# Patient Record
Sex: Female | Born: 1985 | Hispanic: No | Marital: Single | Smoking: Former smoker
Health system: Southern US, Community
[De-identification: ages and names within clinical notes are randomized; demographics above are authoritative.]

## PROBLEM LIST (undated history)

## (undated) DIAGNOSIS — D649 Anemia, unspecified: Secondary | ICD-10-CM

## (undated) HISTORY — PX: ECTOPIC PREGNANCY SURGERY: SHX613

---

## 2018-06-29 ENCOUNTER — Observation Stay (HOSPITAL_COMMUNITY)
Admission: EM | Admit: 2018-06-29 | Discharge: 2018-06-30 | Disposition: A | Discharge status: Home / Self Care | Payer: Self-pay | Attending: Internal Medicine | Admitting: Internal Medicine

## 2018-06-29 ENCOUNTER — Encounter (HOSPITAL_COMMUNITY): Payer: Self-pay | Admitting: Emergency Medicine

## 2018-06-29 ENCOUNTER — Other Ambulatory Visit: Payer: Self-pay

## 2018-06-29 DIAGNOSIS — Z79899 Other long term (current) drug therapy: Secondary | ICD-10-CM | POA: Insufficient documentation

## 2018-06-29 DIAGNOSIS — D509 Iron deficiency anemia, unspecified: Secondary | ICD-10-CM

## 2018-06-29 DIAGNOSIS — R112 Nausea with vomiting, unspecified: Secondary | ICD-10-CM

## 2018-06-29 DIAGNOSIS — N920 Excessive and frequent menstruation with regular cycle: Secondary | ICD-10-CM

## 2018-06-29 DIAGNOSIS — D649 Anemia, unspecified: Secondary | ICD-10-CM

## 2018-06-29 DIAGNOSIS — R51 Headache: Secondary | ICD-10-CM | POA: Insufficient documentation

## 2018-06-29 DIAGNOSIS — D62 Acute posthemorrhagic anemia: Secondary | ICD-10-CM | POA: Diagnosis present

## 2018-06-29 DIAGNOSIS — A084 Viral intestinal infection, unspecified: Principal | ICD-10-CM

## 2018-06-29 DIAGNOSIS — R519 Headache, unspecified: Secondary | ICD-10-CM

## 2018-06-29 DIAGNOSIS — D500 Iron deficiency anemia secondary to blood loss (chronic): Secondary | ICD-10-CM | POA: Insufficient documentation

## 2018-06-29 HISTORY — DX: Anemia, unspecified: D64.9

## 2018-06-29 LAB — CBC
HCT: 24.1 % — ABNORMAL LOW (ref 36.0–46.0)
Hemoglobin: 6.3 g/dL — CL (ref 12.0–15.0)
MCH: 15.9 pg — AB (ref 26.0–34.0)
MCHC: 26.1 g/dL — ABNORMAL LOW (ref 30.0–36.0)
MCV: 61 fL — ABNORMAL LOW (ref 80.0–100.0)
Platelets: 304 10*3/uL (ref 150–400)
RBC: 3.95 MIL/uL (ref 3.87–5.11)
RDW: 21.1 % — ABNORMAL HIGH (ref 11.5–15.5)
WBC: 7.2 10*3/uL (ref 4.0–10.5)
nRBC: 0 % (ref 0.0–0.2)

## 2018-06-29 LAB — URINALYSIS, ROUTINE W REFLEX MICROSCOPIC
Bilirubin Urine: NEGATIVE
Glucose, UA: NEGATIVE mg/dL
Ketones, ur: NEGATIVE mg/dL
Leukocytes,Ua: NEGATIVE
Nitrite: NEGATIVE
Protein, ur: NEGATIVE mg/dL
Specific Gravity, Urine: 1.015 (ref 1.005–1.030)
pH: 5 (ref 5.0–8.0)

## 2018-06-29 LAB — COMPREHENSIVE METABOLIC PANEL
ALBUMIN: 4.1 g/dL (ref 3.5–5.0)
ALT: 13 U/L (ref 0–44)
AST: 18 U/L (ref 15–41)
Alkaline Phosphatase: 39 U/L (ref 38–126)
Anion gap: 10 (ref 5–15)
BILIRUBIN TOTAL: 0.5 mg/dL (ref 0.3–1.2)
BUN: 6 mg/dL (ref 6–20)
CO2: 23 mmol/L (ref 22–32)
Calcium: 9.2 mg/dL (ref 8.9–10.3)
Chloride: 104 mmol/L (ref 98–111)
Creatinine, Ser: 0.68 mg/dL (ref 0.44–1.00)
GFR calc Af Amer: >60 mL/min (ref >60)
GFR calc non Af Amer: >60 mL/min (ref >60)
GLUCOSE: 94 mg/dL (ref 70–99)
Potassium: 3.7 mmol/L (ref 3.5–5.1)
Sodium: 137 mmol/L (ref 135–145)
Total Protein: 6.8 g/dL (ref 6.5–8.1)

## 2018-06-29 LAB — I-STAT BETA HCG BLOOD, ED (MC, WL, AP ONLY): I-stat hCG, quantitative: <5 m[IU]/mL (ref <5)

## 2018-06-29 LAB — LIPASE, BLOOD: Lipase: 36 U/L (ref 11–51)

## 2018-06-29 MED ORDER — ONDANSETRON 4 MG PO TBDP
4.0000 mg | ORAL_TABLET | Freq: Once | ORAL | Status: AC
  Administered 2018-06-29: 4 mg via ORAL
  Filled 2018-06-29: qty 1

## 2018-06-29 MED ORDER — SODIUM CHLORIDE 0.9% FLUSH: 3.0000 mL | Freq: Once | INTRAVENOUS | Status: DC

## 2018-06-29 NOTE — ED Notes (Signed)
EDP [facility] notified of critical hgb. No new orders were provided at this time.

## 2018-06-29 NOTE — ED Triage Notes (Signed)
Pt with sudden onset of vomiting while waiting in the lobby with another patient. She reports 2 episodes and no other symptoms. NAD.

## 2018-06-30 ENCOUNTER — Observation Stay (HOSPITAL_COMMUNITY): Payer: Self-pay

## 2018-06-30 DIAGNOSIS — D509 Iron deficiency anemia, unspecified: Secondary | ICD-10-CM

## 2018-06-30 DIAGNOSIS — D62 Acute posthemorrhagic anemia: Secondary | ICD-10-CM | POA: Diagnosis present

## 2018-06-30 DIAGNOSIS — D649 Anemia, unspecified: Secondary | ICD-10-CM | POA: Diagnosis present

## 2018-06-30 DIAGNOSIS — R51 Headache: Secondary | ICD-10-CM

## 2018-06-30 DIAGNOSIS — A084 Viral intestinal infection, unspecified: Secondary | ICD-10-CM

## 2018-06-30 DIAGNOSIS — N920 Excessive and frequent menstruation with regular cycle: Secondary | ICD-10-CM

## 2018-06-30 DIAGNOSIS — R519 Headache, unspecified: Secondary | ICD-10-CM

## 2018-06-30 LAB — HEMOGLOBIN AND HEMATOCRIT, BLOOD
HEMATOCRIT: 30.2 % — AB (ref 36.0–46.0)
Hemoglobin: 8.7 g/dL — ABNORMAL LOW (ref 12.0–15.0)

## 2018-06-30 LAB — CBC
HCT: 23 % — ABNORMAL LOW (ref 36.0–46.0)
Hemoglobin: 6.2 g/dL — CL (ref 12.0–15.0)
MCH: 16.1 pg — ABNORMAL LOW (ref 26.0–34.0)
MCHC: 27 g/dL — AB (ref 30.0–36.0)
MCV: 59.7 fL — ABNORMAL LOW (ref 80.0–100.0)
Platelets: 277 10*3/uL (ref 150–400)
RBC: 3.85 MIL/uL — ABNORMAL LOW (ref 3.87–5.11)
RDW: 21.2 % — ABNORMAL HIGH (ref 11.5–15.5)
WBC: 5.7 10*3/uL (ref 4.0–10.5)
nRBC: 0 % (ref 0.0–0.2)

## 2018-06-30 LAB — FERRITIN: Ferritin: 1 ng/mL — ABNORMAL LOW (ref 11–307)

## 2018-06-30 LAB — IRON AND TIBC
Iron: 8 ug/dL — ABNORMAL LOW (ref 28–170)
Saturation Ratios: 2 % — ABNORMAL LOW (ref 10.4–31.8)
TIBC: 458 ug/dL — ABNORMAL HIGH (ref 250–450)
UIBC: 450 ug/dL

## 2018-06-30 LAB — PREPARE RBC (CROSSMATCH)

## 2018-06-30 LAB — ABO/RH: ABO/RH(D): B POS

## 2018-06-30 LAB — HIV ANTIBODY (ROUTINE TESTING W REFLEX): HIV Screen 4th Generation wRfx: NONREACTIVE

## 2018-06-30 MED ORDER — ACETAMINOPHEN 650 MG RE SUPP: 650.0000 mg | Freq: Four times a day (QID) | RECTAL | Status: DC | PRN

## 2018-06-30 MED ORDER — MORPHINE SULFATE (PF) 2 MG/ML IV SOLN
INTRAVENOUS | Status: AC
  Administered 2018-06-30: 2 mg via INTRAVENOUS
  Filled 2018-06-30: qty 1

## 2018-06-30 MED ORDER — FERROUS SULFATE 325 (65 FE) MG PO TABS
325.0000 mg | ORAL_TABLET | Freq: Three times a day (TID) | ORAL | Status: DC
  Administered 2018-06-30 (×2): 325 mg via ORAL
  Filled 2018-06-30 (×3): qty 1

## 2018-06-30 MED ORDER — SODIUM CHLORIDE 0.9 % IV SOLN
510.0000 mg | Freq: Once | INTRAVENOUS | Status: AC
  Administered 2018-06-30: 510 mg via INTRAVENOUS
  Filled 2018-06-30: qty 17

## 2018-06-30 MED ORDER — SODIUM CHLORIDE 0.9 % IV SOLN: INTRAVENOUS | Status: AC

## 2018-06-30 MED ORDER — ACETAMINOPHEN 325 MG PO TABS
650.0000 mg | ORAL_TABLET | Freq: Four times a day (QID) | ORAL | Status: DC | PRN
  Administered 2018-06-30 (×2): 650 mg via ORAL
  Filled 2018-06-30 (×2): qty 2

## 2018-06-30 MED ORDER — ONDANSETRON HCL 4 MG/2ML IJ SOLN
4.0000 mg | Freq: Once | INTRAMUSCULAR | Status: AC
  Administered 2018-06-30: 4 mg via INTRAVENOUS
  Filled 2018-06-30: qty 2

## 2018-06-30 MED ORDER — SODIUM CHLORIDE 0.9 % IV SOLN: 10.0000 mL/h | Freq: Once | INTRAVENOUS | Status: DC

## 2018-06-30 MED ORDER — ONDANSETRON HCL 4 MG/2ML IJ SOLN
4.0000 mg | Freq: Four times a day (QID) | INTRAMUSCULAR | Status: DC | PRN
  Administered 2018-06-30: 4 mg via INTRAVENOUS
  Filled 2018-06-30: qty 2

## 2018-06-30 NOTE — Discharge Summary (Signed)
Maureen Hensley, is a 33 y.o. female  DOB 1986-01-03  MRN [id number].  Admission date:  06/29/2018  Admitting Physician  John Giovanni, MD  Discharge Date:  06/30/2018   Primary MD  No primary care provider on file.  Recommendations for primary care physician for things to follow:  -Check CBC, BMP during next visit, patient to follow-up with gynecology service as an outpatient regarding menorrhagia.   Admission Diagnosis  Symptomatic anemia [D64.9] Non-intractable vomiting with nausea, unspecified vomiting type [R11.2]   Discharge Diagnosis  Symptomatic anemia [D64.9] Non-intractable vomiting with nausea, unspecified vomiting type [R11.2]   Principal Problem:   Viral gastroenteritis Active Problems:   Symptomatic anemia   Iron deficiency anemia   Menorrhagia   HA (headache)      Past Medical History:  Diagnosis Date  . Anemia     Past Surgical History:  Procedure Laterality Date  . CESAREAN SECTION     3  . ECTOPIC PREGNANCY SURGERY         History of present illness and  Hospital Course:     Kindly see H&P for history of present illness and admission details, please review complete Labs, Consult reports and Test reports for all details in brief  HPI  from the history and physical done on the day of admission 06/30/2018  33 y.o. female with medical history significant of G4P3 (1 ectopic pregnancy), menorrhagia, anemia presenting to the hospital from the waiting room for evaluation of vomiting.  Patient states she was in her usual state of health and accompanied her sister-in-law to the hospital.  Her sister-in-law has been vomiting.  In the ED waiting room, patient started vomiting all of a sudden.  Reports having a few episodes of NBNB emesis since then.  Denies having any abdominal pain or diarrhea.  Blood work done in the ED revealed hemoglobin 6.3.  Patient reports having chronic  anemia secondary to heavy menstrual bleeding.  She is currently taking an iron supplement at home.  She just moved here recently from [place] and does not have any doctors here.  States her menstrual cycles are usually 7 days long and the first 3 days she has excessive bleeding which requires her to change her pad every hour.  Also notices clots mixed with blood.  Her last menstrual period ended 2 days ago.  She is not having any vaginal bleeding at present.  States she was seen at [facility] in [place] for this problem previously about 2 years ago but was never started on any birth control pills.  She previously received blood transfusion for her anemia about a year ago.  Reports having intermittent headaches and fatigue for the past few days.  Denies having any dizziness, chest pain, or shortness of breath.  Denies having any melena or hematochezia.  Hospital Course   Viral gastroenteritis -While in ED waiting with a family member, she had an episode of nausea and vomiting, this has resolved with no recurrence, tolerating oral intake, afebrile, leukocytosis,  LFTs and lipase within normal limit.  Chronic blood loss anemia/iron deficiency anemia -This is secondary to menorrhagia, hemoglobin 6.3, she was transfused 2 units PRBC, with good response, hemoglobin 8.7 on discharge, very low iron at 8, and very low ferritin level at 1, she is on oral iron supplement at home, attempted to give IV Feraheme, but she did develop lower back pain, so I have stopped her IV Feraheme, no signs of allergic reaction, no hives, no rash, shortness of breath, she was instructed to continue with oral iron supplements on discharge, to follow with PCP as an outpatient, I have given her the information for [facility] on discharge to call and schedule an appointment.   -Patient was in the ED waiting room accompanying a family member and presented with acute onset NBNB emesis.  Family member with similar  symptoms.  Denies having any abdominal pain or diarrhea.  Abdominal exam benign. -Afebrile and no leukocytosis.  LFTs and lipase normal. -IV Zofran PRN -IV fluid hydration    Discharge Condition:  Stable   Follow UP  Follow-up Information    [facility]. Go to.   Why:  your appointment is scheduled for Thursday, July 22, 2018 at 3:00pm.  Please arrive 30 minutes prior to your appointment  Contact information: [address] [place] [address] [phone]       [facility]              Follow up in 1 week(s).   Why:  please call [phone] Contact information: [facility]            Discharge Instructions  and  Discharge Medications     Discharge Instructions    Discharge instructions   Complete by:  As directed    Follow with Primary MD in 7 days   Get CBC, CMP,  checked  by Primary MD next visit.    Activity: As tolerated with Full fall precautions use walker/cane & assistance as needed   Disposition Home    Diet: Regular diet   On your next visit with your primary care physician please Get Medicines reviewed and adjusted.   Please request your Prim.MD to go over all Hospital Tests and Procedure/Radiological results at the follow up, please get all Hospital records sent to your Prim MD by signing hospital release before you go home.   If you experience worsening of your admission symptoms, develop shortness of breath, life threatening emergency, suicidal or homicidal thoughts you must seek medical attention immediately by calling 911 or calling your MD immediately  if symptoms less severe.  You Must read complete instructions/literature along with all the possible adverse reactions/side effects for all the Medicines you take and that have been prescribed to you. Take any new Medicines after you have completely understood and accpet all the possible adverse reactions/side effects.    Do not drive, operating heavy machinery, perform activities at heights, swimming or participation in water activities or provide baby sitting services if your were admitted for syncope or siezures until you have seen by Primary MD or a Neurologist and advised to do so again.  Do not drive when taking Pain medications.    Do not take more than prescribed Pain, Sleep and Anxiety Medications  Special Instructions: If you have smoked or chewed Tobacco  in the last 2 yrs please stop smoking, stop any regular Alcohol  and or any Recreational drug use.  Wear  Seat belts while driving.   Please note  You were cared for by a hospitalist during your hospital stay. If you have any questions about your discharge medications or the care you received while you were in the hospital after you are discharged, you can call the unit and asked to speak with the hospitalist on call if the hospitalist that took care of you is not available. Once you are discharged, your primary care physician will handle any further medical issues. Please note that NO REFILLS for any discharge medications will be authorized once you are discharged, as it is imperative that you return to your primary care physician (or establish a relationship with a primary care physician if you do not have one) for your aftercare needs so that they can reassess your need for medications and monitor your lab values.   Increase activity slowly   Complete by:  As directed      Allergies as of 06/30/2018   No Known Allergies     Medication List    TAKE these medications   ferrous sulfate 325 (65 FE) MG tablet Take 325 mg by mouth 3 (three) times daily with meals.         Diet and Activity recommendation: See Discharge Instructions above   Consults obtained -  None   Major procedures and Radiology Reports - PLEASE review detailed and final reports for all details, in brief -     US Pelvic Complete With Transvaginal  Result  Date: 06/30/2018 CLINICAL DATA:  Initial evaluation for menorrhagia. EXAM: TRANSABDOMINAL AND TRANSVAGINAL ULTRASOUND OF PELVIS TECHNIQUE: Both transabdominal and transvaginal ultrasound examinations of the pelvis were performed. Transabdominal technique was performed for global imaging of the pelvis including uterus, ovaries, adnexal regions, and pelvic cul-de-sac. It was necessary to proceed with endovaginal exam following the transabdominal exam to visualize the uterus, endometrium, and ovaries. COMPARISON:  None FINDINGS: Uterus Measurements: 9.2 x 4.5 x 5.8 cm = volume: 125.5 mL. No fibroids or other mass visualized. Endometrium Thickness: 13.4 mm. Small amount of mildly complex anechoic fluid present within the endometrial cavity. No focal abnormality visualized. Right ovary Measurements: 3.5 x 2.0 x 2.6 cm = volume: 9.3 mL. Normal appearance/no adnexal mass. 1.6 x 1.1 x 1.6 cm simple cyst, most compatible with a normal physiologic cyst/dominant follicle. Left ovary Measurements: 3.2 x 2.1 x 2.2 cm = volume: 7.9 mL. Normal appearance/no adnexal mass. Other findings Small volume free physiologic fluid present within the pelvis. IMPRESSION: 1. Endometrial stripe measures 13.4 mm in thickness without focal abnormality. If bleeding remains unresponsive to hormonal or medical therapy, sonohysterogram should be considered for possible occult focal lesion work-up. (Ref: Radiological Reasoning: Algorithmic Workup of Abnormal Vaginal Bleeding with Endovaginal Sonography and Sonohysterography. AJR 2008; 784:O96-29). 2. Small volume free fluid within the pelvis, presumably physiologic. 3. Otherwise unremarkable and normal pelvic ultrasound. Electronically Signed   By: Rise Mu M.D.   On: 06/30/2018 13:56    Micro Results    No results found for this or any previous visit (from the past 240 hour(s)).     Today   Subjective:   Addisen Elbaz today has any dizziness, lightheadedness, chest pain or  shortness of breath.   Objective:   Blood pressure 108/77, pulse 77, temperature 98.7 F (37.1 C), temperature source Oral, resp. rate 16, height 5\' 2"  (1.575 m), weight 52.2 kg, last menstrual period 06/27/2018, SpO2 100 %.   Intake/Output Summary (Last 24 hours) at 06/30/2018 1617 Last data filed at  06/30/2018 1300 Gross per 24 hour  Intake 1043.25 ml  Output 450 ml  Net 593.25 ml    Exam Awake Alert, Oriented x 3, No new F.N deficits, Normal affect Symmetrical Chest wall movement, Good air movement bilaterally, CTAB RRR,No Gallops,Rubs or new Murmurs, No Parasternal Heave +ve B.Sounds, Abd Soft, No rebound -guarding or rigidity. No Cyanosis, Clubbing or edema, No new Rash or bruise  Data Review   CBC w Diff:  Lab Results  Component Value Date   WBC 5.7 06/30/2018   HGB 8.7 (L) 06/30/2018   HCT 30.2 (L) 06/30/2018   PLT 277 06/30/2018    CMP:  Lab Results  Component Value Date   NA 137 06/29/2018   K 3.7 06/29/2018   CL 104 06/29/2018   CO2 23 06/29/2018   BUN 6 06/29/2018   CREATININE 0.68 06/29/2018   PROT 6.8 06/29/2018   ALBUMIN 4.1 06/29/2018   BILITOT 0.5 06/29/2018   ALKPHOS 39 06/29/2018   AST 18 06/29/2018   ALT 13 06/29/2018  .   Total Time in preparing paper work, data evaluation and todays exam - 30 minutes  Huey Bienenstockawood Nevan Creighton M.D on 06/30/2018 at 4:17 PM  [facility]   Office  [phone]

## 2018-06-30 NOTE — ED Provider Notes (Signed)
TIME SEEN: 1:02 AM  CHIEF COMPLAINT: Nausea, vomiting  HPI: Patient is a 33 year old female with history of anemia with previous blood transfusion from heavy menstrual cycles who presents to the emergency department with nausea and vomiting.  States she was here with her sister-in-law who is also having vomiting when she started vomiting.  She has had total of 3 episodes of nonbloody, nonbilious vomiting.  She reports she did felt lightheaded today and has had some intermittent headaches.  Found to be anemic here with a hemoglobin of 6.3.  States over the past several months she has had more irregular cycles that have been more frequent.  She finished her menstrual cycle 2 days ago.  She states the last 7 days and she will pass clots.  She has had several blood transfusions in the past.  Just recently moved here from [place] and does not have a primary care doctor.  States her last blood transfusion was approximately a year ago.  Does take iron tablets regularly.  Denies any chest pain or shortness of breath.  No hematemesis, hematochezia, melena.  No abdominal pain.  No diarrhea.  No fever.  ROS: See HPI Constitutional: no fever  Eyes: no drainage  ENT: no runny nose   Cardiovascular:  no chest pain  Resp: no SOB  GI:  vomiting GU: no dysuria Integumentary: no rash  Allergy: no hives  Musculoskeletal: no leg swelling  Neurological: no slurred speech ROS otherwise negative  PAST MEDICAL HISTORY/PAST SURGICAL HISTORY:  Past Medical History:  Diagnosis Date  . Anemia     MEDICATIONS:  Prior to Admission medications   Not on File    ALLERGIES:  No Known Allergies  SOCIAL HISTORY:  Social History   Tobacco Use  . Smoking status: Never Smoker  . Smokeless tobacco: Never Used  Substance Use Topics  . Alcohol use: Not Currently    FAMILY HISTORY: No family history on file.  EXAM: BP 114/67 (BP Location: Right Arm)   Pulse 99   Temp 98.2 F (36.8 C)   Resp 19   Ht 5'  2" (1.575 m)   Wt 52.2 kg   LMP 06/27/2018 (Exact Date)   SpO2 100%   BMI 21.03 kg/m  CONSTITUTIONAL: Alert and oriented and responds appropriately to questions. Well-appearing; well-nourished HEAD: Normocephalic EYES: Conjunctivae clear, pupils appear equal, EOMI ENT: normal nose; moist mucous membranes NECK: Supple, no meningismus, no nuchal rigidity, no LAD  CARD: RRR; S1 and S2 appreciated; no murmurs, no clicks, no rubs, no gallops RESP: Normal chest excursion without splinting or tachypnea; breath sounds clear and equal bilaterally; no wheezes, no rhonchi, no rales, no hypoxia or respiratory distress, speaking full sentences ABD/GI: Normal bowel sounds; non-distended; soft, non-tender, no rebound, no guarding, no peritoneal signs, no hepatosplenomegaly BACK:  The back appears normal and is non-tender to palpation, there is no CVA tenderness EXT: Normal ROM in all joints; non-tender to palpation; no edema; normal capillary refill; no cyanosis, no calf tenderness or swelling    SKIN:  warm; no rash, skin appears slightly pale NEURO: Moves all extremities equally PSYCH: The patient's mood and manner are appropriate. Grooming and personal hygiene are appropriate.  MEDICAL DECISION MAKING: Patient here with nausea and vomiting.  Suspect that she has a viral illness given sister-in-law here with similar symptoms.  Abdominal exam is benign.  Doubt appendicitis, colitis, diverticulitis, bowel obstruction, pancreatitis, cholecystitis.  Labs found to be unremarkable other than hemoglobin level of 6.3.  I suspect  this is from her heavy menstrual cycles.  She has had previous blood transfusions, last was approximately 1 year ago.  Is on iron tablets.  Blood pressure currently in the 100 systolic and she appears pale but otherwise is hemodynamically stable.  I recommend a blood transfusion and she agrees.  We will give 2 units packed red blood cells and admit to medicine.  ED PROGRESS: 1:36 AM  Discussed patient's case with hospitalist, Dr. Loney Loh.  I have recommended admission and patient (and family if present) agree with this plan. Admitting physician will place admission orders.   I reviewed all nursing notes, vitals, pertinent previous records, EKGs, lab and urine results, imaging (as available).       CRITICAL CARE Performed by: Rochele Raring   Total critical care time: 45 minutes  Critical care time was exclusive of separately billable procedures and treating other patients.  Critical care was necessary to treat or prevent imminent or life-threatening deterioration.  Critical care was time spent personally by me on the following activities: development of treatment plan with patient and/or surrogate as well as nursing, discussions with consultants, evaluation of patient's response to treatment, examination of patient, obtaining history from patient or surrogate, ordering and performing treatments and interventions, ordering and review of laboratory studies, ordering and review of radiographic studies, pulse oximetry and re-evaluation of patient's condition.    Mylin Gignac, Layla Maw, DO 06/30/18 [phone]

## 2018-06-30 NOTE — Discharge Instructions (Signed)
Follow with Primary MD  in 7 days   Get CBC, CMP,  checked  by Primary MD next visit.    Activity: As tolerated with Full fall precautions use walker/cane & assistance as needed   Disposition Home    Diet: Regular diet   On your next visit with your primary care physician please Get Medicines reviewed and adjusted.   Please request your Prim.MD to go over all Hospital Tests and Procedure/Radiological results at the follow up, please get all Hospital records sent to your Prim MD by signing hospital release before you go home.   If you experience worsening of your admission symptoms, develop shortness of breath, life threatening emergency, suicidal or homicidal thoughts you must seek medical attention immediately by calling 911 or calling your MD immediately  if symptoms less severe.  You Must read complete instructions/literature along with all the possible adverse reactions/side effects for all the Medicines you take and that have been prescribed to you. Take any new Medicines after you have completely understood and accpet all the possible adverse reactions/side effects.   Do not drive, operating heavy machinery, perform activities at heights, swimming or participation in water activities or provide baby sitting services if your were admitted for syncope or siezures until you have seen by Primary MD or a Neurologist and advised to do so again.  Do not drive when taking Pain medications.    Do not take more than prescribed Pain, Sleep and Anxiety Medications  Special Instructions: If you have smoked or chewed Tobacco  in the last 2 yrs please stop smoking, stop any regular Alcohol  and or any Recreational drug use.  Wear Seat belts while driving.   Please note  You were cared for by a hospitalist during your hospital stay. If you have any questions about your discharge medications or the care you received while you were in the hospital after you are discharged, you can call the  unit and asked to speak with the hospitalist on call if the hospitalist that took care of you is not available. Once you are discharged, your primary care physician will handle any further medical issues. Please note that NO REFILLS for any discharge medications will be authorized once you are discharged, as it is imperative that you return to your primary care physician (or establish a relationship with a primary care physician if you do not have one) for your aftercare needs so that they can reassess your need for medications and monitor your lab values.  

## 2018-06-30 NOTE — H&P (Addendum)
History and Physical    Maureen Hensley MGQ:[id number] DOB: 01-07-1986 DOA: 06/29/2018  PCP: No primary care provider on file. Patient coming from: ED waiting room  Chief Complaint: Vomiting  HPI: Maureen Hensley is a 33 y.o. female with medical history significant of G4P3 (1 ectopic pregnancy), menorrhagia, anemia presenting to the hospital from the waiting room for evaluation of vomiting.  Patient states she was in her usual state of health and accompanied her sister-in-law to the hospital.  Her sister-in-law has been vomiting.  In the ED waiting room, patient started vomiting all of a sudden.  Reports having a few episodes of NBNB emesis since then.  Denies having any abdominal pain or diarrhea.  Blood work done in the ED revealed hemoglobin 6.3.  Patient reports having chronic anemia secondary to heavy menstrual bleeding.  She is currently taking an iron supplement at home.  She just moved here recently from [place] and does not have any doctors here.  States her menstrual cycles are usually 7 days long and the first 3 days she has excessive bleeding which requires her to change her pad every hour.  Also notices clots mixed with blood.  Her last menstrual period ended 2 days ago.  She is not having any vaginal bleeding at present.  States she was seen at [facility] in [place] for this problem previously about 2 years ago but was never started on any birth control pills.  She previously received blood transfusion for her anemia about a year ago.  Reports having intermittent headaches and fatigue for the past few days.  Denies having any dizziness, chest pain, or shortness of breath.  Denies having any melena or hematochezia.  Review of Systems: As per HPI otherwise 10 point review of systems negative.  Past Medical History:  Diagnosis Date  . Anemia     Past Surgical History:  Procedure Laterality Date  . CESAREAN SECTION     3  . ECTOPIC PREGNANCY SURGERY       reports that  she has never smoked. She has never used smokeless tobacco. She reports previous alcohol use. She reports previous drug use.  No Known Allergies  No family history on file.  Prior to Admission medications   Medication Sig Start Date End Date Taking? Authorizing Provider  ferrous sulfate 325 (65 FE) MG tablet Take 325 mg by mouth 3 (three) times daily with meals.   Yes [provider]    Physical Exam: Vitals:   06/30/18 0145 06/30/18 0200 06/30/18 0226 06/30/18 0228  BP: 106/75 107/69 100/60   Pulse: 93 87 82   Resp: 15 (!) 22 18   Temp:    98.2 F (36.8 C)  TempSrc:    Oral  SpO2: 100% 100% 100%   Weight:      Height:        Physical Exam  Constitutional: She is oriented to person, place, and time. She appears well-developed and well-nourished. No distress.  HENT:  Head: Normocephalic.  Mouth/Throat: Oropharynx is clear and moist.  Eyes: Right eye exhibits no discharge. Left eye exhibits no discharge.  Neck: Neck supple.  Cardiovascular: Normal rate, regular rhythm and intact distal pulses.  Pulmonary/Chest: Effort normal and breath sounds normal. No respiratory distress. She has no wheezes. She has no rales.  Abdominal: Soft. Bowel sounds are normal. She exhibits no distension. There is no abdominal tenderness. There is no rebound and no guarding.  Musculoskeletal:  General: No edema.  Neurological: She is alert and oriented to person, place, and time. No cranial nerve deficit.  Strength 5 out of 5 in bilateral upper and lower extremities. Sensation to light touch intact throughout.  Skin: Skin is warm and dry. She is not diaphoretic.  Psychiatric: She has a normal mood and affect. Her behavior is normal.     Labs on Admission: I have personally reviewed following labs and imaging studies  CBC: Recent Labs  Lab 06/29/18 2214  WBC 7.2  HGB 6.3*  HCT 24.1*  MCV 61.0*  PLT 304   Basic Metabolic Panel: Recent Labs  Lab 06/29/18 2214  NA 137    K 3.7  CL 104  CO2 23  GLUCOSE 94  BUN 6  CREATININE 0.68  CALCIUM 9.2   GFR: Estimated Creatinine Clearance: 80.6 mL/min (by C-G formula based on SCr of 0.68 mg/dL). Liver Function Tests: Recent Labs  Lab 06/29/18 2214  AST 18  ALT 13  ALKPHOS 39  BILITOT 0.5  PROT 6.8  ALBUMIN 4.1   Recent Labs  Lab 06/29/18 2214  LIPASE 36   No results for input(s): AMMONIA in the last 168 hours. Coagulation Profile: No results for input(s): INR, PROTIME in the last 168 hours. Cardiac Enzymes: No results for input(s): CKTOTAL, CKMB, CKMBINDEX, TROPONINI in the last 168 hours. BNP (last 3 results) No results for input(s): PROBNP in the last 8760 hours. HbA1C: No results for input(s): HGBA1C in the last 72 hours. CBG: No results for input(s): GLUCAP in the last 168 hours. Lipid Profile: No results for input(s): CHOL, HDL, LDLCALC, TRIG, CHOLHDL, LDLDIRECT in the last 72 hours. Thyroid Function Tests: No results for input(s): TSH, T4TOTAL, FREET4, T3FREE, THYROIDAB in the last 72 hours. Anemia Panel: No results for input(s): VITAMINB12, FOLATE, FERRITIN, TIBC, IRON, RETICCTPCT in the last 72 hours. Urine analysis:    Component Value Date/Time   COLORURINE YELLOW 06/29/2018 2206   APPEARANCEUR HAZY (A) 06/29/2018 2206   LABSPEC 1.015 06/29/2018 2206   PHURINE 5.0 06/29/2018 2206   GLUCOSEU NEGATIVE 06/29/2018 2206   HGBUR SMALL (A) 06/29/2018 2206   BILIRUBINUR NEGATIVE 06/29/2018 2206   KETONESUR NEGATIVE 06/29/2018 2206   PROTEINUR NEGATIVE 06/29/2018 2206   NITRITE NEGATIVE 06/29/2018 2206   LEUKOCYTESUR NEGATIVE 06/29/2018 2206    Radiological Exams on Admission: No results found.  Assessment/Plan Principal Problem:   Viral gastroenteritis Active Problems:   Symptomatic anemia   Iron deficiency anemia   Menorrhagia   HA (headache)   Viral gastroenteritis -Patient was in the ED waiting room accompanying a family member and presented with acute onset NBNB  emesis.  Family member with similar symptoms.  Denies having any abdominal pain or diarrhea.  Abdominal exam benign. -Afebrile and no leukocytosis.  LFTs and lipase normal. -IV Zofran PRN -IV fluid hydration  Symptomatic iron deficiency anemia secondary to menorrhagia -Patient is presenting with complaints of fatigue.  Hemoglobin 6.3 and MCV 61, no prior baseline.  Her last menstrual cycle ended 2 days ago.  No vaginal bleeding at present.  Hemodynamically stable. -Type and screen -2 units PRBCs ordered in the ED -Repeat CBC in a.m. -Check iron, ferritin, TIBC -Continue home iron supplement -Complete pelvic ultrasound -Consider discussing with gynecology in the morning.  Intermittent headaches -Patient reports having intermittent headaches for the past few days. -No neuro deficits and appears very comfortable on exam. -Tylenol PRN  DVT prophylaxis: SCDs Code Status: Full code Family Communication: No family available. Disposition Plan:  Anticipate discharge in 1 to 2 days. Consults called: None Admission status: Observation   John Giovanni MD [facility] Pager [phone]  If 7PM-7AM, please contact night-coverage [email or web address] Password [email or web address]  06/30/2018, 2:54 AM

## 2018-06-30 NOTE — Progress Notes (Signed)
Pt noted to have severe pain to feraheme. IV stopped immediately, and MD notified. Pt was given 2mg  IV morphine for severe pain. Plan to [facility] pt for a couple of hours and then discharge home. Pt is to follow up outpatient with OBGYN. Pt is no longer in acute distress. Continue to monitor.

## 2018-06-30 NOTE — Care Management Note (Signed)
Case Management Note  Patient Details  Name: DEVION OKONSKI MRN: [id number] Date of Birth: 03-22-1986  Subjective/Objective:   33 yr old female seen in Ed for vomiting. Being evaluated for viral gastroenteritis and menorrhagia.  Will be transfused to units for HGB of 6.3 today.                Action/Plan: CM was asked to make appointment for patient who is uninsured and has no PCP. CM scheduled appointment with [facility] for Thursday, July 22, 2018 at 3:00pm. Appointment has been added to discharge.    Expected Discharge Date:    07/29/18              Expected Discharge Plan:  Home/Self Care  In-House Referral:  NA  Discharge planning Services  CM Consult, Follow-up appt scheduled, [facility]  Post Acute Care Choice:  NA Choice offered to:  NA  DME Arranged:  N/A DME Agency:  NA  HH Arranged:  NA HH Agency:  NA  Status of Service:  Completed, signed off  If discussed at Long Length of Stay Meetings, dates discussed:    Additional Comments:  Durenda Guthrie, RN 06/30/2018, 10:38 AM

## 2018-06-30 NOTE — Plan of Care (Signed)
  Problem: RH SAFETY Goal: RH STG ADHERE TO SAFETY PRECAUTIONS W/ASSISTANCE/DEVICE Description STG Adhere to Safety Precautions With Assistance/Device. Outcome: Adequate for Discharge Goal: RH STG DECREASED RISK OF FALL WITH ASSISTANCE Description STG Decreased Risk of Fall With Assistance. Outcome: Adequate for Discharge   Problem: Consults Goal: [facility] GENERAL PATIENT EDUCATION Description See Patient Education module for education specifics. Outcome: Adequate for Discharge Goal: Skin Care Protocol Initiated - if Braden Score 18 or less Description If consults are not indicated, leave blank or document N/A Outcome: Adequate for Discharge Goal: Nutrition Consult-if indicated Outcome: Adequate for Discharge Goal: Diabetes Guidelines if Diabetic/Glucose > 140 Description If diabetic or lab glucose is > 140 mg/dl - Initiate Diabetes/Hyperglycemia Guidelines & Document Interventions  Outcome: Adequate for Discharge   Problem: Education: Goal: Knowledge of General Education information will improve Description Including pain rating scale, medication(s)/side effects and non-pharmacologic comfort measures Outcome: Adequate for Discharge   Problem: Health Behavior/Discharge Planning: Goal: Ability to manage health-related needs will improve Outcome: Adequate for Discharge   Problem: Clinical Measurements: Goal: Ability to maintain clinical measurements within normal limits will improve Outcome: Adequate for Discharge Goal: Will remain free from infection Outcome: Adequate for Discharge Goal: Diagnostic test results will improve Outcome: Adequate for Discharge Goal: Respiratory complications will improve Outcome: Adequate for Discharge Goal: Cardiovascular complication will be avoided Outcome: Adequate for Discharge   Problem: Activity: Goal: Risk for activity intolerance will decrease Outcome: Adequate for Discharge   Problem: Nutrition: Goal: Adequate nutrition will be  maintained Outcome: Adequate for Discharge   Problem: Coping: Goal: Level of anxiety will decrease Outcome: Adequate for Discharge   Problem: Elimination: Goal: Will not experience complications related to bowel motility Outcome: Adequate for Discharge Goal: Will not experience complications related to urinary retention Outcome: Adequate for Discharge   Problem: Pain Managment: Goal: General experience of comfort will improve Outcome: Adequate for Discharge   Problem: Safety: Goal: Ability to remain free from injury will improve Outcome: Adequate for Discharge   Problem: Skin Integrity: Goal: Risk for impaired skin integrity will decrease Outcome: Adequate for Discharge

## 2018-07-01 ENCOUNTER — Encounter: Payer: Self-pay | Admitting: Obstetrics & Gynecology

## 2018-07-01 LAB — BPAM RBC
Blood Product Expiration Date: 202003192359
Blood Product Expiration Date: 202003192359
ISSUE DATE / TIME: 202002260303
ISSUE DATE / TIME: 202002260900
Unit Type and Rh: 7300
Unit Type and Rh: 7300

## 2018-07-01 LAB — TYPE AND SCREEN
ABO/RH(D): B POS
Antibody Screen: NEGATIVE
Unit division: 0
Unit division: 0

## 2018-07-10 ENCOUNTER — Emergency Department (HOSPITAL_COMMUNITY)
Admission: EM | Admit: 2018-07-10 | Discharge: 2018-07-10 | Disposition: A | Discharge status: Home / Self Care | Payer: Self-pay | Attending: Emergency Medicine | Admitting: Emergency Medicine

## 2018-07-10 ENCOUNTER — Encounter (HOSPITAL_COMMUNITY): Payer: Self-pay | Admitting: Emergency Medicine

## 2018-07-10 ENCOUNTER — Other Ambulatory Visit: Payer: Self-pay

## 2018-07-10 DIAGNOSIS — M5431 Sciatica, right side: Secondary | ICD-10-CM | POA: Insufficient documentation

## 2018-07-10 DIAGNOSIS — Z79899 Other long term (current) drug therapy: Secondary | ICD-10-CM | POA: Insufficient documentation

## 2018-07-10 MED ORDER — METHOCARBAMOL 500 MG PO TABS: 500.0000 mg | ORAL_TABLET | Freq: Two times a day (BID) | ORAL | 0 refills | Status: DC

## 2018-07-10 NOTE — ED Notes (Signed)
Discharge instructions and prescription discussed with Pt. Pt verbalized understanding. Pt stable and ambulatory.    

## 2018-07-10 NOTE — ED Provider Notes (Signed)
[facility] EMERGENCY DEPARTMENT Provider Note   CSN: [id number] Arrival date & time: 07/10/18  1256    History   Chief Complaint Chief Complaint  Patient presents with  . Leg Pain    HPI Maureen Hensley is a 33 y.o. female.     HPI   33 year old female presents today with complaints of right back and leg pain.  She notes symptoms started 2 days ago with an ache in her low back radiating down into her right leg.  She notes this is worse with ambulation worse with straight leg raise.  She denies any loss of distal sensation strength or motor function.  She denies any fever, trauma, lower extremity swelling or edema, or any red flags for back pain.  She notes taking Tylenol without symptomatic improvement.  No history of the same.    Past Medical History:  Diagnosis Date  . Anemia     Patient Active Problem List   Diagnosis Date Noted  . Symptomatic anemia 06/30/2018  . Iron deficiency anemia 06/30/2018  . Menorrhagia 06/30/2018  . Viral gastroenteritis 06/30/2018  . HA (headache) 06/30/2018    Past Surgical History:  Procedure Laterality Date  . CESAREAN SECTION     3  . ECTOPIC PREGNANCY SURGERY       OB History   No obstetric history on file.      Home Medications    Prior to Admission medications   Medication Sig Start Date End Date Taking? Authorizing Provider  ferrous sulfate 325 (65 FE) MG tablet Take 325 mg by mouth 3 (three) times daily with meals.    [provider]  methocarbamol (ROBAXIN) 500 MG tablet Take 1 tablet (500 mg total) by mouth 2 (two) times daily. 07/10/18   Eyvonne Mechanic, PA-C    Family History History reviewed. No pertinent family history.  Social History Social History   Tobacco Use  . Smoking status: Never Smoker  . Smokeless tobacco: Never Used  Substance Use Topics  . Alcohol use: Not Currently  . Drug use: Not Currently     Allergies   Patient has no known allergies.   Review of  Systems Review of Systems  All other systems reviewed and are negative.    Physical Exam Updated Vital Signs BP 100/72 (BP Location: Right Arm) Comment: baseline per pt  Pulse 91   Temp 97.6 F (36.4 C) (Oral)   Resp 16   LMP 06/27/2018 (Exact Date)   SpO2 100%   Physical Exam Vitals signs and nursing note reviewed.  Constitutional:      Appearance: She is well-developed.  HENT:     Head: Normocephalic and atraumatic.  Eyes:     General: No scleral icterus.       Right eye: No discharge.        Left eye: No discharge.     Conjunctiva/sclera: Conjunctivae normal.     Pupils: Pupils are equal, round, and reactive to light.  Neck:     Musculoskeletal: Normal range of motion.     Vascular: No JVD.     Trachea: No tracheal deviation.  Pulmonary:     Effort: Pulmonary effort is normal.     Breath sounds: No stridor.  Musculoskeletal:     Comments: No CT or L-spine tenderness palpation, no redness swelling or erythema, tenderness palpation of right upper gluteus, straight leg positive, no swelling or edema to the bilateral lower extremities, distal sensation strength and motor function intact  Neurological:     Mental Status: She is alert and oriented to person, place, and time.     Coordination: Coordination normal.  Psychiatric:        Behavior: Behavior normal.        Thought Content: Thought content normal.        Judgment: Judgment normal.      ED Treatments / Results  Labs (all labs ordered are listed, but only abnormal results are displayed) Labs Reviewed - No data to display  EKG None  Radiology No results found.  Procedures Procedures (including critical care time)  Medications Ordered in ED Medications - No data to display   Initial Impression / Assessment and Plan / ED Course  I have reviewed the triage vital signs and the nursing notes.  Pertinent labs & imaging results that were available during my care of the patient were reviewed by me and  considered in my medical decision making (see chart for details).        33 year old female presents today with likely sciatica.  She has no red flags for back pain, well-appearing in no acute distress.  She will be discharged with symptomatic care and strict return precautions.  She verbalized understanding and agreement to today's plan and had no further questions or concerns at the time of discharge.   Final Clinical Impressions(s) / ED Diagnoses   Final diagnoses:  Sciatica of right side    ED Discharge Orders         Ordered    methocarbamol (ROBAXIN) 500 MG tablet  2 times daily     07/10/18 1315           Eyvonne Mechanic, PA-C 07/10/18 1315    Shaune Pollack, MD 07/10/18 [id number]

## 2018-07-10 NOTE — Discharge Instructions (Addendum)
Please read attached information. If you experience any new or worsening signs or symptoms please return to the emergency room for evaluation. Please follow-up with your primary care provider or specialist as discussed. Please use medication prescribed only as directed and discontinue taking if you have any concerning signs or symptoms.   °

## 2018-07-10 NOTE — ED Triage Notes (Signed)
Pt reports lower back and leg pain x 2 days, no relief with icy hot, denies injury.

## 2018-07-22 ENCOUNTER — Inpatient Hospital Stay: Payer: Self-pay | Admitting: Critical Care Medicine

## 2018-07-26 ENCOUNTER — Telehealth: Payer: Self-pay | Admitting: Obstetrics & Gynecology

## 2018-07-26 ENCOUNTER — Other Ambulatory Visit: Payer: Self-pay

## 2018-07-26 NOTE — Progress Notes (Signed)
I called to do her Telemedicine visit as scheduled. I could hear a child crying in the background. I asked her if this was a good time for the visit and she said "No". I asked if she would like to reschedule and she said "yes". I told her to speak with the front office staff to reschedule.

## 2018-07-26 NOTE — Telephone Encounter (Signed)
Called the patient to inform of the cancellation of appointment due the virus restrictions. Left a detailed voicemail with scripting. Also informed the patient of the option of a virtual visit, if interested please call our clinic.

## 2018-12-29 ENCOUNTER — Encounter (HOSPITAL_COMMUNITY): Payer: Self-pay | Admitting: Emergency Medicine

## 2018-12-29 ENCOUNTER — Emergency Department (HOSPITAL_COMMUNITY)
Admission: EM | Admit: 2018-12-29 | Discharge: 2018-12-29 | Disposition: A | Discharge status: Home / Self Care | Payer: Self-pay | Attending: Emergency Medicine | Admitting: Emergency Medicine

## 2018-12-29 DIAGNOSIS — K1370 Unspecified lesions of oral mucosa: Secondary | ICD-10-CM | POA: Insufficient documentation

## 2018-12-29 MED ORDER — ACYCLOVIR 400 MG PO TABS: 400.0000 mg | ORAL_TABLET | Freq: Three times a day (TID) | ORAL | 0 refills | Status: DC

## 2018-12-29 MED ORDER — OXYCODONE-ACETAMINOPHEN 5-325 MG PO TABS: 1.0000 | ORAL_TABLET | Freq: Four times a day (QID) | ORAL | 0 refills | Status: DC | PRN

## 2018-12-29 MED ORDER — LIDOCAINE VISCOUS HCL 2 % MT SOLN: 15.0000 mL | OROMUCOSAL | 0 refills | Status: DC | PRN

## 2018-12-29 MED ORDER — OXYCODONE-ACETAMINOPHEN 5-325 MG PO TABS
1.0000 | ORAL_TABLET | Freq: Once | ORAL | Status: AC
  Administered 2018-12-29: 13:00:00 1 via ORAL
  Filled 2018-12-29: qty 1

## 2018-12-29 NOTE — Discharge Instructions (Addendum)
Please read attached information. If you experience any new or worsening signs or symptoms please return to the emergency room for evaluation. Please follow-up with your primary care provider or specialist as discussed. Please use medication prescribed only as directed and discontinue taking if you have any concerning signs or symptoms.   °

## 2018-12-29 NOTE — ED Provider Notes (Signed)
[facility] EMERGENCY DEPARTMENT Provider Note   CSN: [id number] Arrival date & time: 12/29/18  1104     History   Chief Complaint Chief Complaint  Patient presents with  . Oral Swelling    HPI Maureen Hensley is a 33 y.o. female.     HPI   33 year old female presents today with a 2-day history of swelling to her lips.  She notes pain and swelling to the bottom lip with sores in her mouth.  She denies any fever or systemic symptoms, no other rashes noted.  She notes she has children at home but they are not experiencing similar symptoms.  She notes she has had this several times in the past and was placed on antibiotics for this.  She denies any history of HSV, she denies any chronic health conditions she is not pregnant or breast-feeding.   Past Medical History:  Diagnosis Date  . Anemia     Patient Active Problem List   Diagnosis Date Noted  . Symptomatic anemia 06/30/2018  . Iron deficiency anemia 06/30/2018  . Menorrhagia 06/30/2018  . Viral gastroenteritis 06/30/2018  . HA (headache) 06/30/2018    Past Surgical History:  Procedure Laterality Date  . CESAREAN SECTION     3  . ECTOPIC PREGNANCY SURGERY       OB History   No obstetric history on file.      Home Medications    Prior to Admission medications   Medication Sig Start Date End Date Taking? Authorizing Provider  acyclovir (ZOVIRAX) 400 MG tablet Take 1 tablet (400 mg total) by mouth 3 (three) times daily. 12/29/18   Zeke Aker, Dellis Filbert, PA-C  ferrous sulfate 325 (65 FE) MG tablet Take 325 mg by mouth 3 (three) times daily with meals.    [provider]  lidocaine (XYLOCAINE) 2 % solution Use as directed 15 mLs in the mouth or throat as needed for mouth pain. 12/29/18   Latrelle Bazar, Dellis Filbert, PA-C  methocarbamol (ROBAXIN) 500 MG tablet Take 1 tablet (500 mg total) by mouth 2 (two) times daily. 07/10/18   Adiba Fargnoli, Dellis Filbert, PA-C  oxyCODONE-acetaminophen (PERCOCET/ROXICET) 5-325 MG tablet  Take 1 tablet by mouth every 6 (six) hours as needed. 12/29/18   Okey Regal, PA-C    Family History No family history on file.  Social History Social History   Tobacco Use  . Smoking status: Never Smoker  . Smokeless tobacco: Never Used  Substance Use Topics  . Alcohol use: Not Currently  . Drug use: Not Currently     Allergies   Patient has no known allergies.   Review of Systems Review of Systems  All other systems reviewed and are negative.    Physical Exam Updated Vital Signs BP 101/71 (BP Location: Right Arm)   Pulse 85   Temp 98.6 F (37 C) (Oral)   Resp 16   SpO2 100%   Physical Exam Vitals signs and nursing note reviewed.  Constitutional:      Appearance: She is well-developed.  HENT:     Head: Normocephalic and atraumatic.     Comments: Pearline Cables ulcerations noted to the lower lip and buccal mucosa -no lesions noted to the tongue or posterior oropharynx, no signs of surrounding erythema to the lip, minor bleeding noted Eyes:     General: No scleral icterus.       Right eye: No discharge.        Left eye: No discharge.     Conjunctiva/sclera: Conjunctivae normal.  Pupils: Pupils are equal, round, and reactive to light.  Neck:     Musculoskeletal: Normal range of motion.     Vascular: No JVD.     Trachea: No tracheal deviation.  Pulmonary:     Effort: Pulmonary effort is normal.     Breath sounds: No stridor.  Skin:    Comments: Single macule of the right palm, single macule to the left sole- remained of skin without lesions  Neurological:     Mental Status: She is alert and oriented to person, place, and time.     Coordination: Coordination normal.  Psychiatric:        Behavior: Behavior normal.        Thought Content: Thought content normal.        Judgment: Judgment normal.      ED Treatments / Results  Labs (all labs ordered are listed, but only abnormal results are displayed) Labs Reviewed  HSV CULTURE AND TYPING    EKG None   Radiology No results found.  Procedures Procedures (including critical care time)  Medications Ordered in ED Medications  oxyCODONE-acetaminophen (PERCOCET/ROXICET) 5-325 MG per tablet 1 tablet (1 tablet Oral Given 12/29/18 1235)     Initial Impression / Assessment and Plan / ED Course  I have reviewed the triage vital signs and the nursing notes.  Pertinent labs & imaging results that were available during my care of the patient were reviewed by me and considered in my medical decision making (see chart for details).        33 year old female presents today with oral swelling.  This appears to be a viral etiology, question HSV, also question hand-foot-and-mouth although very limited involvement of the hand and feet.  She is well-appearing in no acute distress.  She is tolerating oral.  Patient will be discharged with pain medication oral rinse and acyclovir to cover for questionable HSV.  Viral culture sent.  She will return immediately if she develops any new or worsening signs or symptoms.  Verbalized understanding and agreement to this plan had no further questions concerns the time discharge.  Final Clinical Impressions(s) / ED Diagnoses   Final diagnoses:  Unspecified lesions of oral mucosa    ED Discharge Orders         Ordered    oxyCODONE-acetaminophen (PERCOCET/ROXICET) 5-325 MG tablet  Every 6 hours PRN     12/29/18 1253    lidocaine (XYLOCAINE) 2 % solution  As needed     12/29/18 1253    acyclovir (ZOVIRAX) 400 MG tablet  3 times daily     12/29/18 1253           Eyvonne MechanicHedges, Alajiah Dutkiewicz, Cordelia Poche-C 12/29/18 1313    Loren RacerYelverton, David, MD 12/29/18 1348

## 2018-12-29 NOTE — ED Triage Notes (Signed)
Pt here with c/o swelling to the lower lip times 2 days pt has this happen before and needed antibiotics  nad noted

## 2018-12-29 NOTE — ED Notes (Signed)
Patient verbalizes understanding of discharge instructions. Opportunity for questioning and answers were provided. Armband removed by staff, pt discharged from ED.  

## 2018-12-29 NOTE — ED Notes (Signed)
ED Provider at bedside. 

## 2018-12-31 LAB — HSV CULTURE AND TYPING

## 2019-03-08 ENCOUNTER — Emergency Department (HOSPITAL_COMMUNITY): Payer: Self-pay

## 2019-03-08 ENCOUNTER — Encounter (HOSPITAL_COMMUNITY): Payer: Self-pay

## 2019-03-08 ENCOUNTER — Other Ambulatory Visit: Payer: Self-pay

## 2019-03-08 ENCOUNTER — Emergency Department (HOSPITAL_COMMUNITY)
Admission: EM | Admit: 2019-03-08 | Discharge: 2019-03-08 | Disposition: A | Discharge status: Home / Self Care | Payer: Self-pay | Attending: Emergency Medicine | Admitting: Emergency Medicine

## 2019-03-08 DIAGNOSIS — R102 Pelvic and perineal pain: Secondary | ICD-10-CM | POA: Insufficient documentation

## 2019-03-08 DIAGNOSIS — R110 Nausea: Secondary | ICD-10-CM | POA: Insufficient documentation

## 2019-03-08 DIAGNOSIS — R1032 Left lower quadrant pain: Secondary | ICD-10-CM

## 2019-03-08 LAB — URINALYSIS, ROUTINE W REFLEX MICROSCOPIC
Bacteria, UA: NONE SEEN
Bilirubin Urine: NEGATIVE
Glucose, UA: NEGATIVE mg/dL
Ketones, ur: NEGATIVE mg/dL
Leukocytes,Ua: NEGATIVE
Nitrite: NEGATIVE
Protein, ur: 30 mg/dL — AB
Specific Gravity, Urine: 1.02 (ref 1.005–1.030)
pH: 5 (ref 5.0–8.0)

## 2019-03-08 LAB — COMPREHENSIVE METABOLIC PANEL
ALT: 12 U/L (ref 0–44)
AST: 13 U/L — ABNORMAL LOW (ref 15–41)
Albumin: 3.8 g/dL (ref 3.5–5.0)
Alkaline Phosphatase: 40 U/L (ref 38–126)
Anion gap: 11 (ref 5–15)
BUN: 9 mg/dL (ref 6–20)
CO2: 20 mmol/L — ABNORMAL LOW (ref 22–32)
Calcium: 8.9 mg/dL (ref 8.9–10.3)
Chloride: 105 mmol/L (ref 98–111)
Creatinine, Ser: 0.74 mg/dL (ref 0.44–1.00)
GFR calc Af Amer: >60 mL/min (ref >60)
GFR calc non Af Amer: >60 mL/min (ref >60)
Glucose, Bld: 98 mg/dL (ref 70–99)
Potassium: 4.1 mmol/L (ref 3.5–5.1)
Sodium: 136 mmol/L (ref 135–145)
Total Bilirubin: 0.4 mg/dL (ref 0.3–1.2)
Total Protein: 6.6 g/dL (ref 6.5–8.1)

## 2019-03-08 LAB — I-STAT BETA HCG BLOOD, ED (MC, WL, AP ONLY): I-stat hCG, quantitative: <5 m[IU]/mL (ref <5)

## 2019-03-08 LAB — WET PREP, GENITAL
Clue Cells Wet Prep HPF POC: NONE SEEN
Sperm: NONE SEEN
Trich, Wet Prep: NONE SEEN
Yeast Wet Prep HPF POC: NONE SEEN

## 2019-03-08 LAB — CBC
HCT: 26.7 % — ABNORMAL LOW (ref 36.0–46.0)
Hemoglobin: 7.4 g/dL — ABNORMAL LOW (ref 12.0–15.0)
MCH: 17.7 pg — ABNORMAL LOW (ref 26.0–34.0)
MCHC: 27.7 g/dL — ABNORMAL LOW (ref 30.0–36.0)
MCV: 64 fL — ABNORMAL LOW (ref 80.0–100.0)
Platelets: 320 10*3/uL (ref 150–400)
RBC: 4.17 MIL/uL (ref 3.87–5.11)
RDW: 18.6 % — ABNORMAL HIGH (ref 11.5–15.5)
WBC: 5.3 10*3/uL (ref 4.0–10.5)
nRBC: 0 % (ref 0.0–0.2)

## 2019-03-08 LAB — LIPASE, BLOOD: Lipase: 32 U/L (ref 11–51)

## 2019-03-08 MED ORDER — KETOROLAC TROMETHAMINE 30 MG/ML IJ SOLN
30.0000 mg | Freq: Once | INTRAMUSCULAR | Status: AC
  Administered 2019-03-08: 15:00:00 30 mg via INTRAVENOUS
  Filled 2019-03-08: qty 1

## 2019-03-08 MED ORDER — NAPROXEN 500 MG PO TABS: 500.0000 mg | ORAL_TABLET | Freq: Two times a day (BID) | ORAL | 0 refills | Status: DC

## 2019-03-08 MED ORDER — SODIUM CHLORIDE 0.9% FLUSH: 3.0000 mL | Freq: Once | INTRAVENOUS | Status: DC

## 2019-03-08 NOTE — ED Notes (Signed)
Patient verbalizes understanding of discharge instructions. Opportunity for questioning and answers were provided. Armband removed by staff, pt discharged from ED.  

## 2019-03-08 NOTE — ED Provider Notes (Signed)
[facility] EMERGENCY DEPARTMENT Provider Note   CSN: [id number] Arrival date & time: 03/08/19  1133     History   Chief Complaint Chief Complaint  Patient presents with  . Abdominal Pain    HPI Maureen Hensley is a 33 y.o. female with a past medical history of IDA presents to ED for gradual onset of lower abdominal pain for the past 8 hours.  Started her menstrual cycle yesterday.  Initially started off as cramps but have gotten worse.  She has not tried any medications to help with her symptoms.  Denies any vaginal discharge.  Does report nausea but denies vomiting.  Denies changes to bowel movements, vaginal discharge, concern for STDs, sick contacts with similar symptoms.  Prior abdominal surgeries include C-section.  Denies lightheadedness, shortness of breath.     HPI  Past Medical History:  Diagnosis Date  . Anemia     Patient Active Problem List   Diagnosis Date Noted  . Symptomatic anemia 06/30/2018  . Iron deficiency anemia 06/30/2018  . Menorrhagia 06/30/2018  . Viral gastroenteritis 06/30/2018  . HA (headache) 06/30/2018    Past Surgical History:  Procedure Laterality Date  . CESAREAN SECTION     3  . ECTOPIC PREGNANCY SURGERY       OB History   No obstetric history on file.      Home Medications    Prior to Admission medications   Medication Sig Start Date End Date Taking? Authorizing Provider  ferrous sulfate 325 (65 FE) MG tablet Take 325 mg by mouth 3 (three) times daily with meals.   Yes [provider]  naproxen (NAPROSYN) 500 MG tablet Take 1 tablet (500 mg total) by mouth 2 (two) times daily. 03/08/19   Delia Heady, PA-C    Family History No family history on file.  Social History Social History   Tobacco Use  . Smoking status: Never Smoker  . Smokeless tobacco: Never Used  Substance Use Topics  . Alcohol use: Not Currently  . Drug use: Not Currently     Allergies   Patient has no known allergies.   Review of Systems Review of Systems  Constitutional: Negative for appetite change, chills and fever.  HENT: Negative for ear pain, rhinorrhea, sneezing and sore throat.   Eyes: Negative for photophobia and visual disturbance.  Respiratory: Negative for cough, chest tightness, shortness of breath and wheezing.   Cardiovascular: Negative for chest pain and palpitations.  Gastrointestinal: Positive for abdominal pain and nausea. Negative for blood in stool, constipation, diarrhea and vomiting.  Genitourinary: Positive for pelvic pain. Negative for dysuria, hematuria and urgency.  Musculoskeletal: Negative for myalgias.  Skin: Negative for rash.  Neurological: Negative for dizziness, weakness and light-headedness.     Physical Exam Updated Vital Signs BP 107/74   Pulse 92   Temp 98.3 F (36.8 C) (Oral)   Resp 17   Ht 5\' 2"  (1.575 m)   Wt 54.4 kg   SpO2 100%   BMI 21.95 kg/m   Physical Exam Vitals signs and nursing note reviewed. Exam conducted with a chaperone present.  Constitutional:      General: She is not in acute distress.    Appearance: She is well-developed.  HENT:     Head: Normocephalic and atraumatic.     Nose: Nose normal.  Eyes:     General: No scleral icterus.       Left eye: No discharge.     Conjunctiva/sclera: Conjunctivae normal.  Neck:     Musculoskeletal: Normal range of motion and neck supple.  Cardiovascular:     Rate and Rhythm: Normal rate and regular rhythm.     Heart sounds: Normal heart sounds. No murmur. No friction rub. No gallop.   Pulmonary:     Effort: Pulmonary effort is normal. No respiratory distress.     Breath sounds: Normal breath sounds.  Abdominal:     General: Bowel sounds are normal. There is no distension.     Palpations: Abdomen is soft.     Tenderness: There is abdominal tenderness in the right lower quadrant, suprapubic area and left lower quadrant. There is no guarding.  Genitourinary:    Vagina: Bleeding present.      Cervix: No cervical motion tenderness.     Adnexa:        Right: Tenderness present.        Left: Tenderness present.      Comments: Pelvic exam: normal external genitalia without evidence of trauma. VULVA: normal appearing vulva with no masses, tenderness or lesion. VAGINA: normal appearing vagina with normal color and discharge, bright red blood in vaginal vault. CERVIX: normal appearing cervix without lesions, cervical motion tenderness absent, cervical os closed with out purulent discharge; No vaginal discharge. Wet prep and DNA probe for chlamydia and GC obtained.   ADNEXA: normal adnexa in size bilateral tenderness on exam.  No masses. UTERUS: uterus is normal size, shape, consistency and nontender.   Musculoskeletal: Normal range of motion.  Skin:    General: Skin is warm and dry.     Findings: No rash.  Neurological:     Mental Status: She is alert.     Motor: No abnormal muscle tone.     Coordination: Coordination normal.      ED Treatments / Results  Labs (all labs ordered are listed, but only abnormal results are displayed) Labs Reviewed  WET PREP, GENITAL - Abnormal; Notable for the following components:      Result Value   WBC, Wet Prep HPF POC FEW (*)    All other components within normal limits  COMPREHENSIVE METABOLIC PANEL - Abnormal; Notable for the following components:   CO2 20 (*)    AST 13 (*)    All other components within normal limits  CBC - Abnormal; Notable for the following components:   Hemoglobin 7.4 (*)    HCT 26.7 (*)    MCV 64.0 (*)    MCH 17.7 (*)    MCHC 27.7 (*)    RDW 18.6 (*)    All other components within normal limits  URINALYSIS, ROUTINE W REFLEX MICROSCOPIC - Abnormal; Notable for the following components:   Color, Urine AMBER (*)    APPearance HAZY (*)    Hgb urine dipstick LARGE (*)    Protein, ur 30 (*)    All other components within normal limits  LIPASE, BLOOD  I-STAT BETA HCG BLOOD, ED ([facility], [facility], [facility] ONLY)  GC/CHLAMYDIA  PROBE AMP ([place]) NOT AT [facility]    EKG None  Radiology US Pelvic Complete W Transvaginal And Torsion R/o  Result Date: 03/08/2019 CLINICAL DATA:  Left lower quadrant pain EXAM: TRANSABDOMINAL AND TRANSVAGINAL ULTRASOUND OF PELVIS DOPPLER ULTRASOUND OF OVARIES TECHNIQUE: Both transabdominal and transvaginal ultrasound examinations of the pelvis were performed. Transabdominal technique was performed for global imaging of the pelvis including uterus, ovaries, adnexal regions, and pelvic cul-de-sac. It was necessary to proceed with endovaginal exam following the transabdominal exam to visualize the  ovaries. Color and duplex Doppler ultrasound was utilized to evaluate blood flow to the ovaries. COMPARISON:  06/30/2018 FINDINGS: Uterus Measurements: 9.4 x 5 x 5.6 cm = volume: 136 mL. Heterogeneous without fibroids or other mass visualized. Endometrium Thickness: 21.  No focal abnormality visualized. Right ovary Measurements: 3.2 x 1.9 x 2.5 cm = volume: 8 mL. Normal appearance/no adnexal mass. Left ovary Measurements: 3.1 x 1.8 x 2.6 cm = volume: 7.6 mL. Normal appearance/no adnexal mass. Pulsed Doppler evaluation of both ovaries demonstrates normal low-resistance arterial and venous waveforms. Other findings Small volume free fluid. IMPRESSION: Small volume free fluid, which is probably physiologic. No mass or evidence of torsion. Electronically Signed   By: Guadlupe SpanishPraneil  Patel M.D.   On: 03/08/2019 17:07    Procedures Procedures (including critical care time)  Medications Ordered in ED Medications  sodium chloride flush (NS) 0.9 % injection 3 mL (has no administration in time range)  ketorolac (TORADOL) 30 MG/ML injection 30 mg (30 mg Intravenous Given 03/08/19 1521)     Initial Impression / Assessment and Plan / ED Course  I have reviewed the triage vital signs and the nursing notes.  Pertinent labs & imaging results that were available during my care of the patient were reviewed by me and  considered in my medical decision making (see chart for details).  Clinical Course as of Mar 07 1820  Tue Mar 08, 2019  1814 Patient with improvement in symptoms with Toradol.  Resting comfortably.   [HK]    Clinical Course User Index [HK] Dietrich PatesKhatri, Amjad Fikes, PA-C       33 year old female presenting to the ED with a chief complaint of pelvic pain.  Reports beginning her menstrual cycle yesterday.  States that this feels different than her usual cramps.  Patient has heavy menstrual periods at baseline.  Reports pain in the lower abdomen.  Pelvic exam revealed bilateral adnexal tenderness without cervical motion tenderness.  No focal tenderness on exam.  Today she shows hemoglobin of 7.4 which appears similar to baseline.  CMP, lipase, hCG unremarkable.  Wet prep negative.  GC chlamydia probe pending.  Pelvic ultrasound with no acute abnormalities.  Patient was given Toradol with significant improvement in her symptoms.  Suspect that her symptoms are due to her menstrual cycle.  Will continue with NSAIDs, PCP follow-up and return precautions.  Patient is hemodynamically stable, in NAD, and able to ambulate in the ED. Evaluation does not show pathology that would require ongoing emergent intervention or inpatient treatment. I explained the diagnosis to the patient. Pain has been managed and has no complaints prior to discharge. Patient is comfortable with above plan and is stable for discharge at this time. All questions were answered prior to disposition. Strict return precautions for returning to the ED were discussed. Encouraged follow up with PCP.   An After Visit Summary was printed and given to the patient.   Portions of this note were generated with Scientist, clinical (histocompatibility and immunogenetics)Dragon dictation software. Dictation errors may occur despite best attempts at proofreading.   Final Clinical Impressions(s) / ED Diagnoses   Final diagnoses:  Pelvic pain in female    ED Discharge Orders         Ordered    naproxen  (NAPROSYN) 500 MG tablet  2 times daily     03/08/19 1820           Dietrich PatesKhatri, Yanisa Goodgame, PA-C 03/08/19 1821    Raeford RazorKohut, Stephen, MD 03/09/19 [id number]

## 2019-03-08 NOTE — Discharge Instructions (Signed)
Return to the ED if you start to experience worsening symptoms, lightheadedness, severe abdominal pain with fever, shortness of breath.

## 2019-03-08 NOTE — ED Triage Notes (Signed)
Pt presents w/abd pain starting this am at 0615. Pt started her menstrual cycle this am. Denies being pregnant.

## 2019-03-10 LAB — GC/CHLAMYDIA PROBE AMP (~~LOC~~) NOT AT ARMC
Chlamydia: NEGATIVE
Neisseria Gonorrhea: NEGATIVE

## 2019-12-18 IMAGING — US US PELVIS COMPLETE WITH TRANSVAGINAL
1 series · 13 of 25 positions shown · non-contrast
Comparison: None

CLINICAL DATA: Initial evaluation for menorrhagia.



[Series 1: us pelvis complete with transvaginal · 13 of 115 slices shown]
[im 1/115]
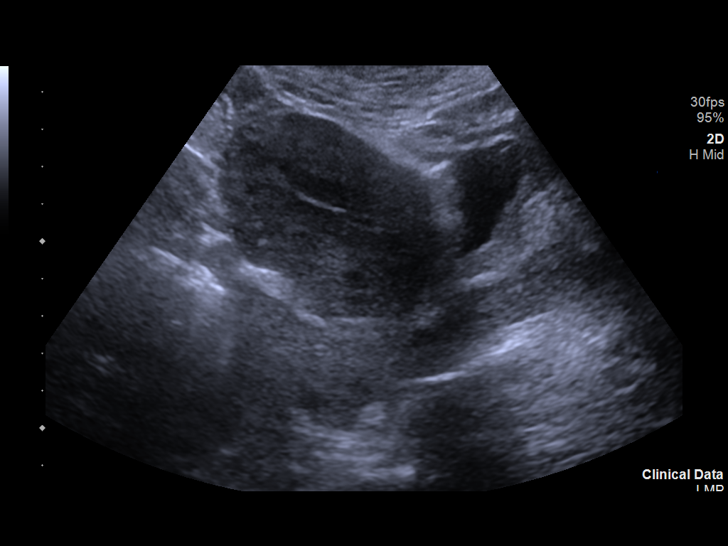
[im 10/115]
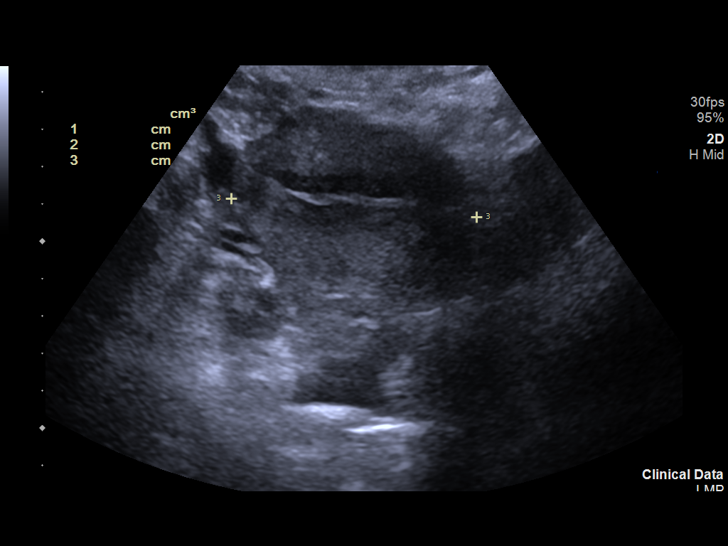
[im 20/115]
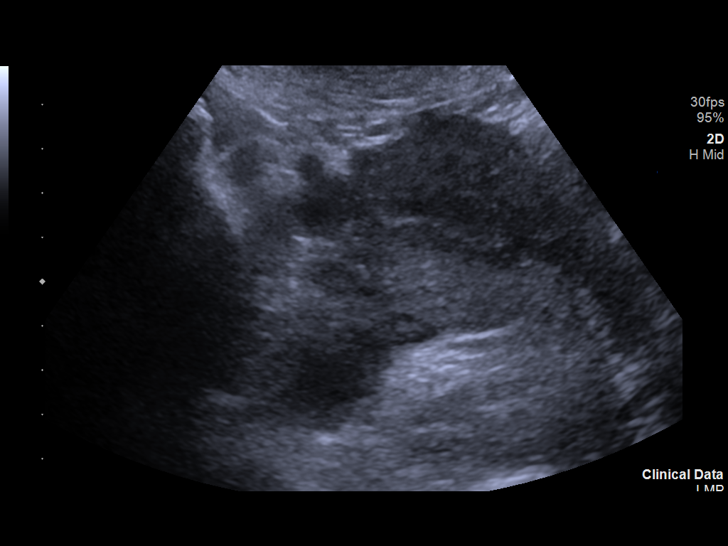
[im 29/115]
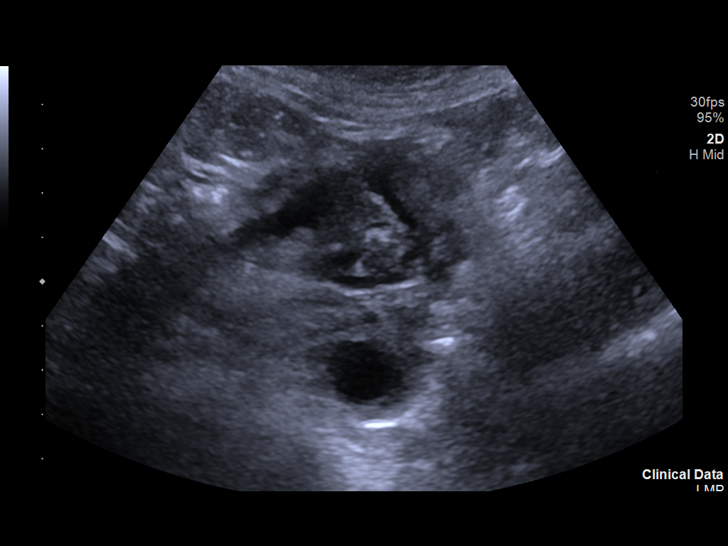
[im 39/115]
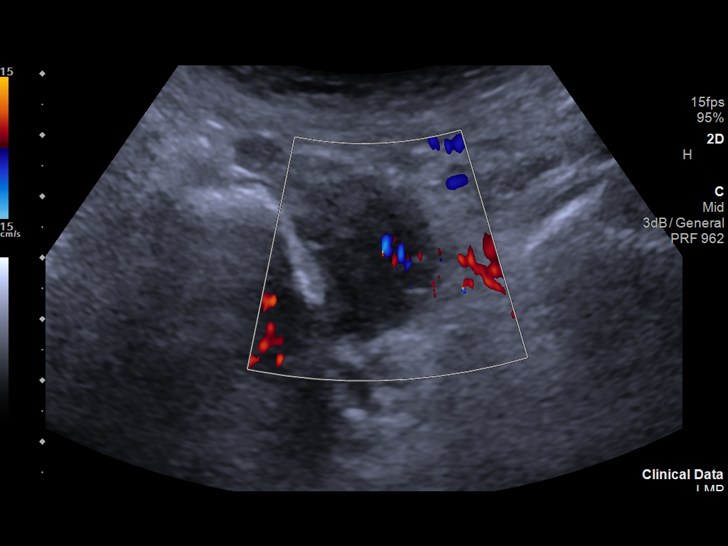
[im 48/115]
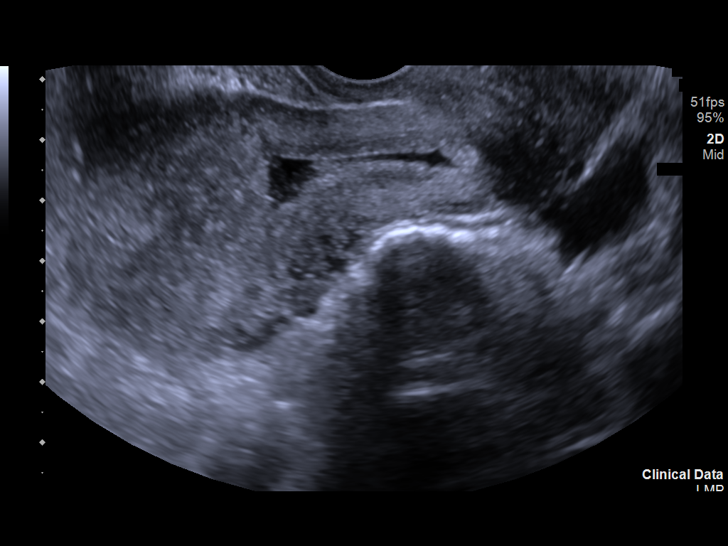
[im 58/115]
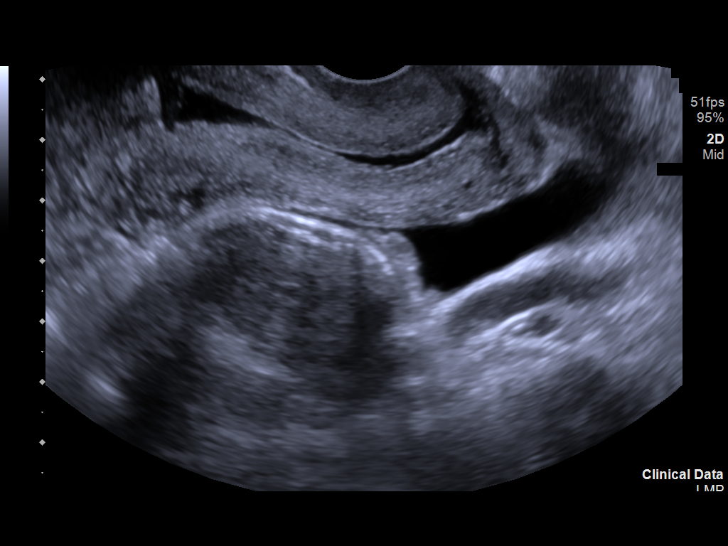
[im 67/115]
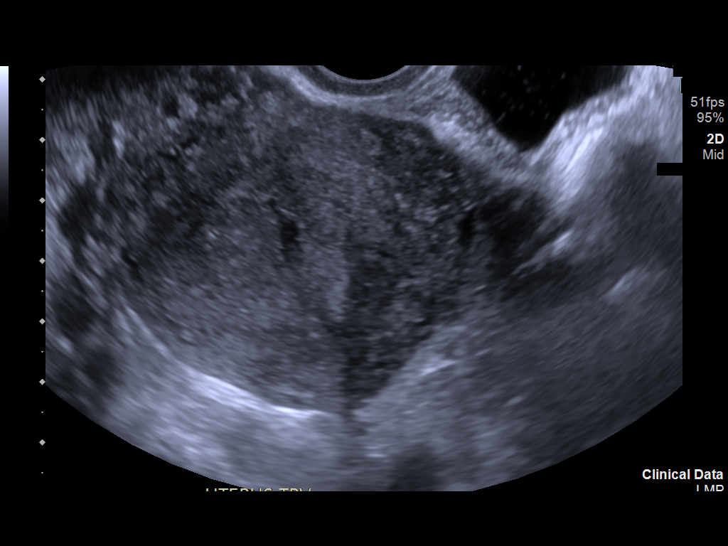
[im 77/115]
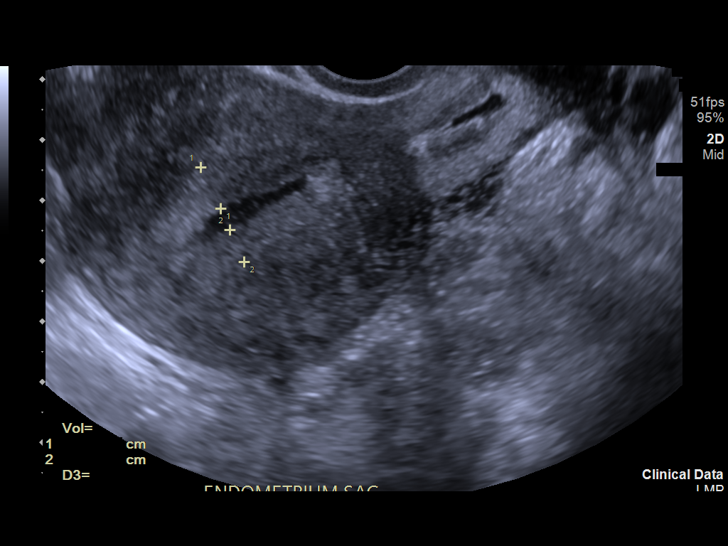
[im 86/115]
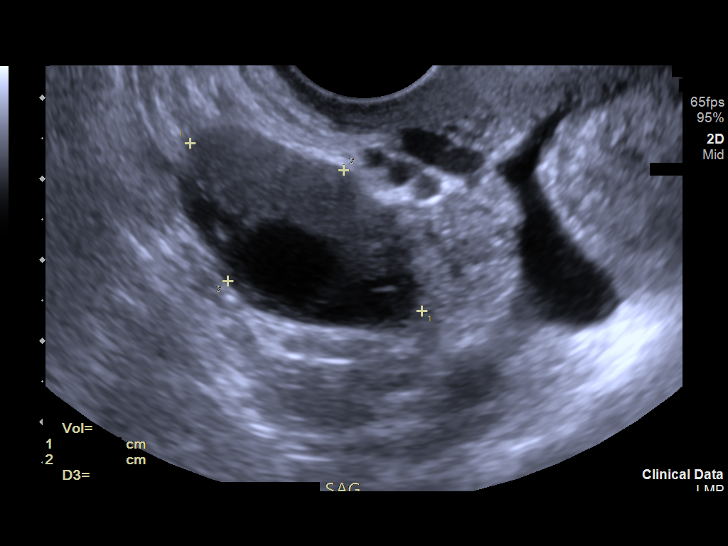
[im 96/115]
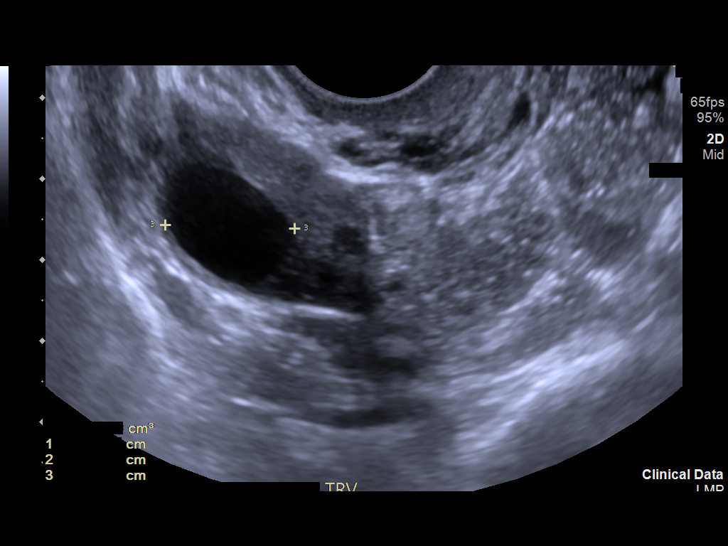
[im 105/115]
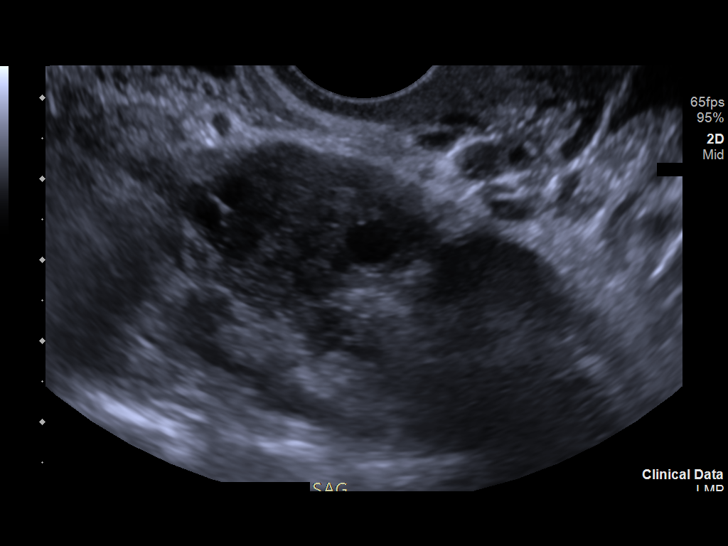
[im 115/115]
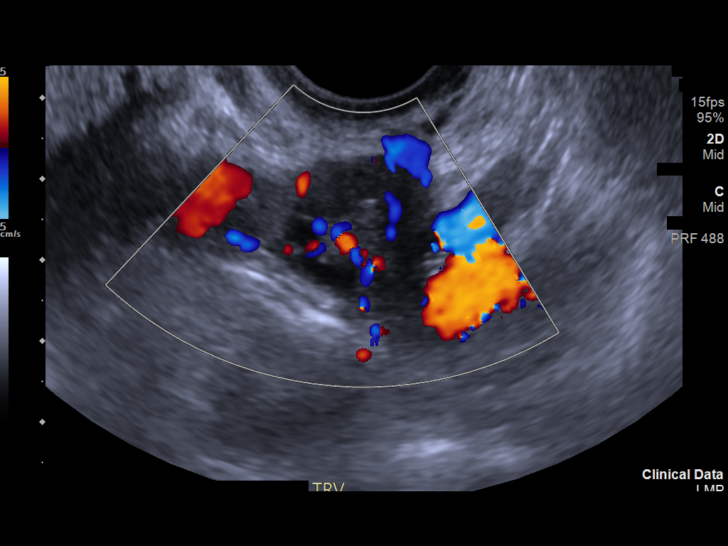

[13 of 25 positions shown; findings below may reference images not displayed]

FINDINGS: Uterus

Measurements: 9.2 x 4.5 x 5.8 cm = volume: 125.5 mL. No fibroids or
other mass visualized.

Endometrium

Thickness: 13.4 mm. Small amount of mildly complex anechoic fluid
present within the endometrial cavity. No focal abnormality
visualized.

Right ovary

Measurements: 3.5 x 2.0 x 2.6 cm = volume: 9.3 mL. Normal
appearance/no adnexal mass. 1.6 x 1.1 x 1.6 cm simple cyst, most
compatible with a normal physiologic cyst/dominant follicle.

Left ovary

Measurements: 3.2 x 2.1 x 2.2 cm = volume: 7.9 mL. Normal
appearance/no adnexal mass.

Other findings

Small volume free physiologic fluid present within the pelvis.
IMPRESSION: 1. Endometrial stripe measures 13.4 mm in thickness without focal
abnormality. If bleeding remains unresponsive to hormonal or medical
therapy, sonohysterogram should be considered for possible occult
focal lesion work-up. (Ref: Radiological Reasoning: Algorithmic
Workup of Abnormal Vaginal Bleeding with Endovaginal Sonography and
Sonohysterography. AJR 3339; 191:S68-73).
2. Small volume free fluid within the pelvis, presumably
physiologic.
3. Otherwise unremarkable and normal pelvic ultrasound.

## 2020-08-25 IMAGING — US US PELVIS COMPLETE TRANSABD/TRANSVAG W DUPLEX
1 series · 13 of 25 positions shown · non-contrast
Comparison: 06/30/2018

CLINICAL DATA: Left lower quadrant pain

EXAM:
TRANSABDOMINAL AND TRANSVAGINAL ULTRASOUND OF PELVIS
DOPPLER ULTRASOUND OF OVARIES
TECHNIQUE: Both transabdominal and transvaginal ultrasound examinations of the
pelvis were performed. Transabdominal technique was performed for
global imaging of the pelvis including uterus, ovaries, adnexal
regions, and pelvic cul-de-sac.
It was necessary to proceed with endovaginal exam following the
transabdominal exam to visualize the ovaries. Color and duplex
Doppler ultrasound was utilized to evaluate blood flow to the
ovaries.

[Series 1: us pelvis complete transabd/transvag w duplex · 92 acquisitions, 13 frames shown]
[im 1/92]
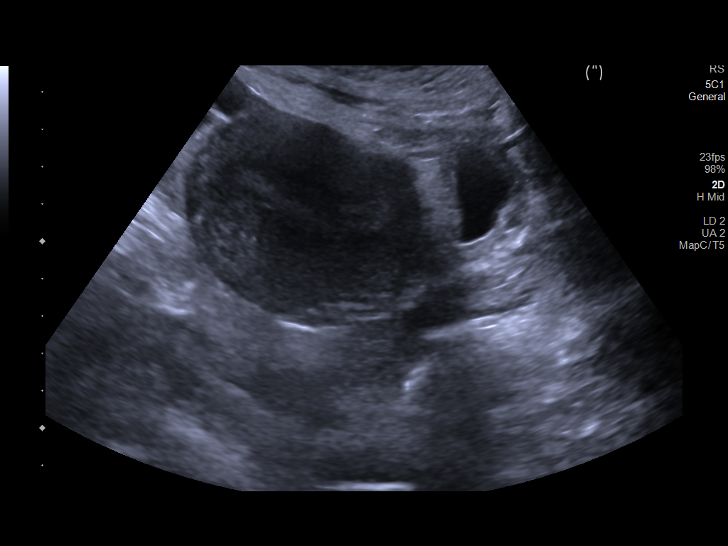
[im 8/92]
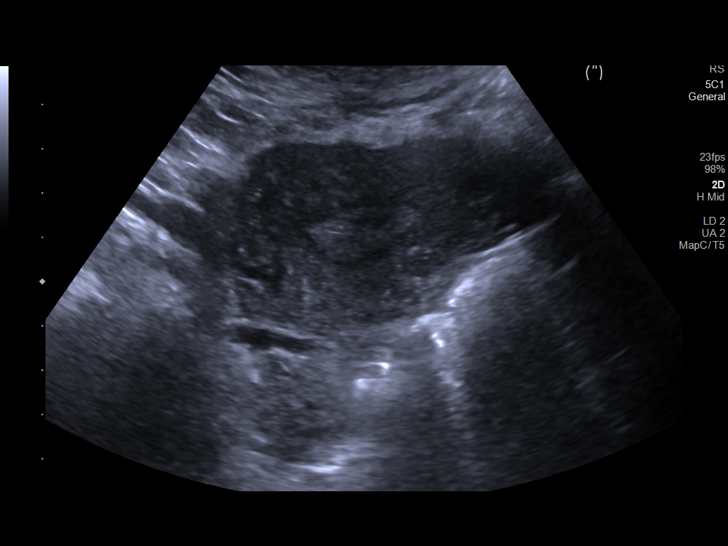
[im 16/92]
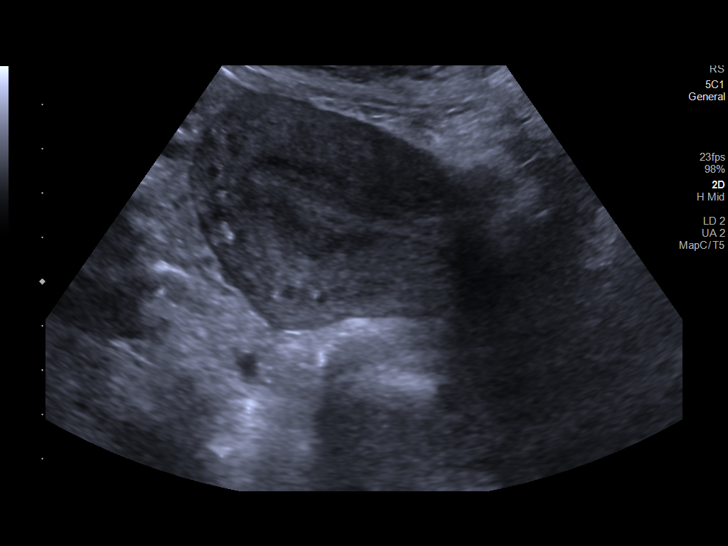
[im 23/92]
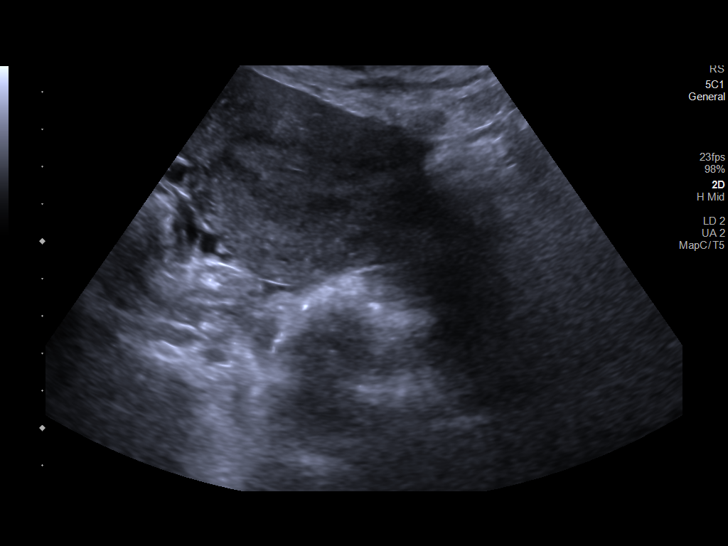
[im 31/92]
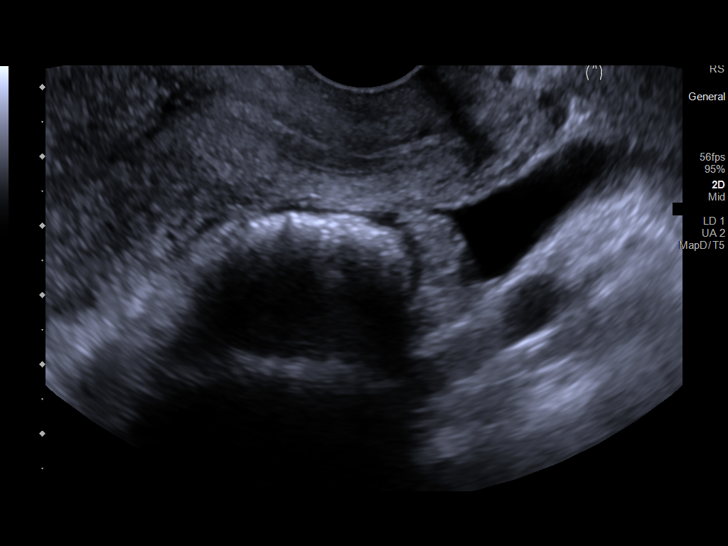
[im 38/92]
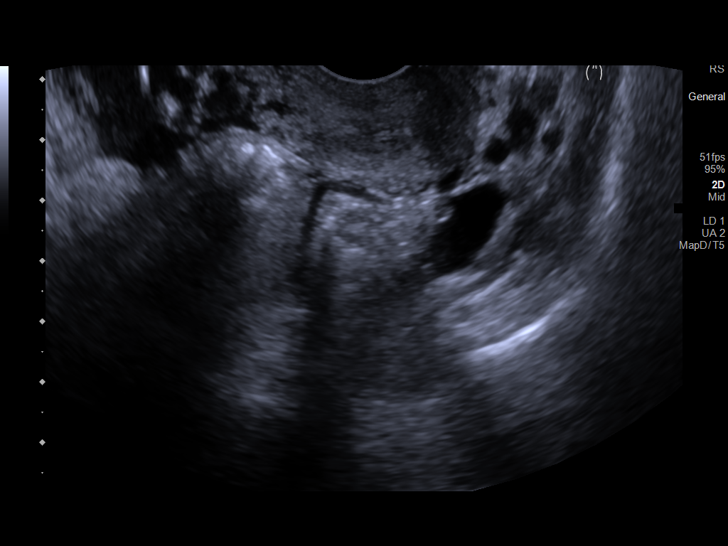
[im 46/92]
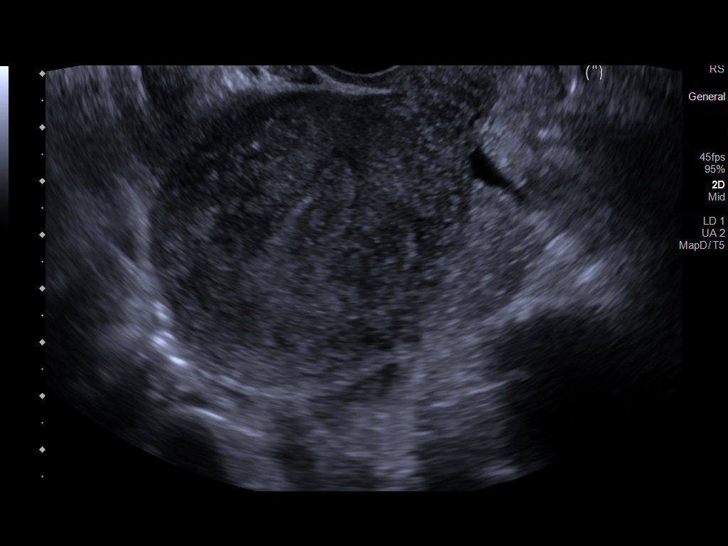
[im 54/92]
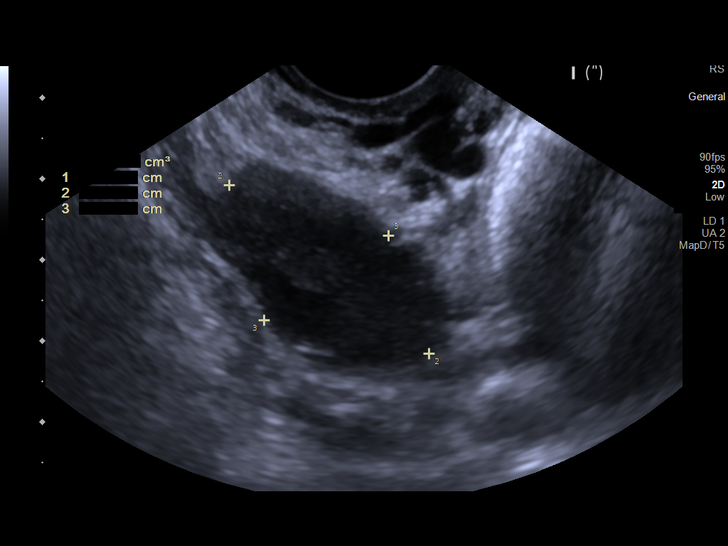
[im 61/92]
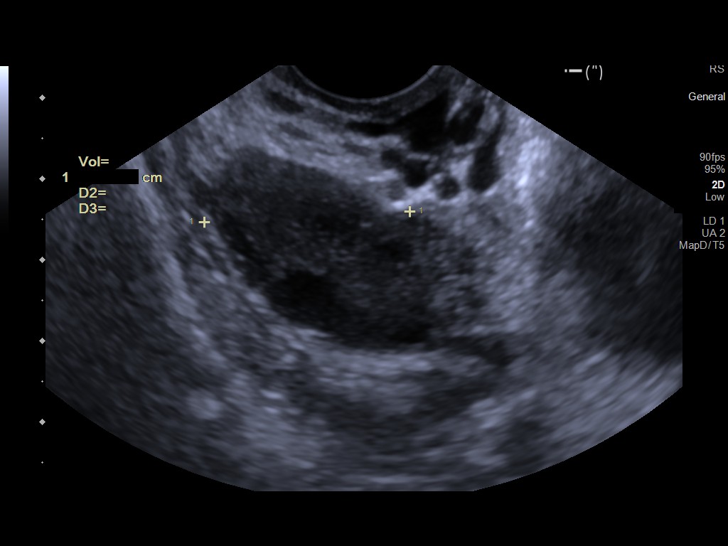
[im 69/92]
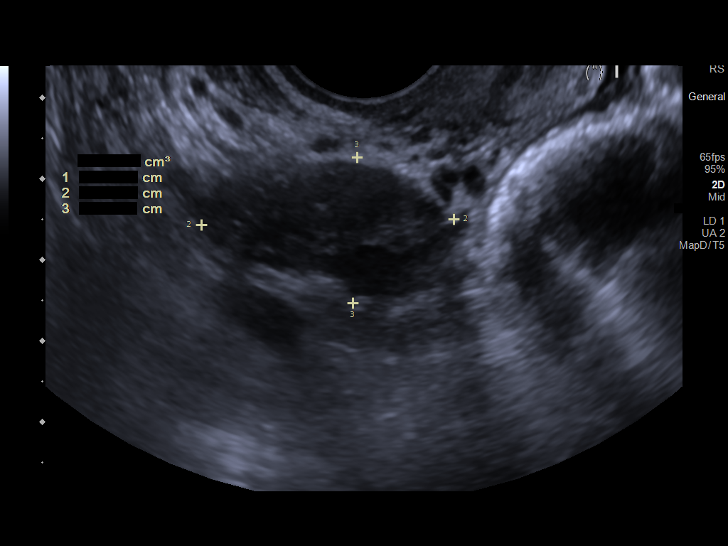
[im 76/92]
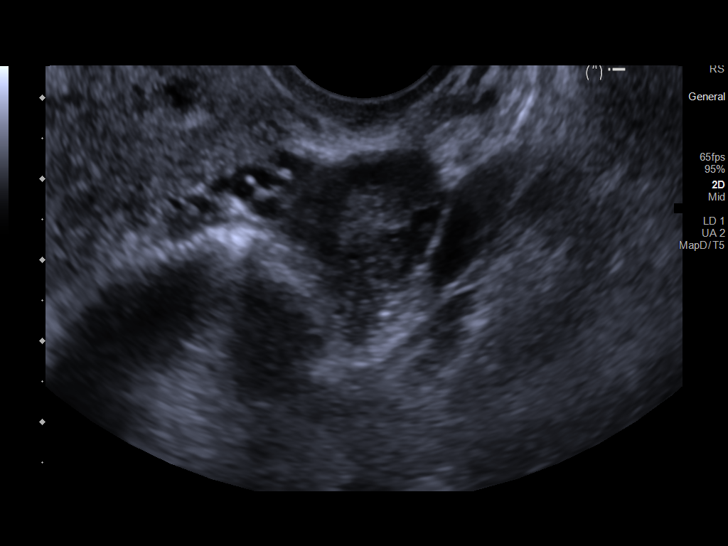
[im 84/92]
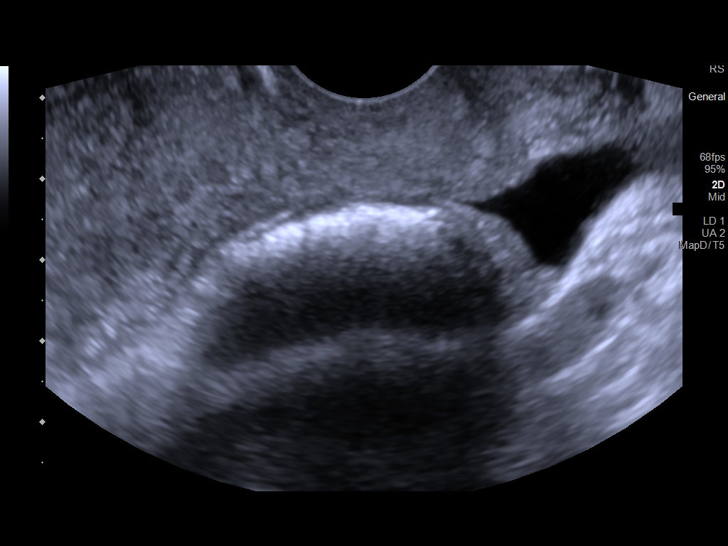
[im 92/92]
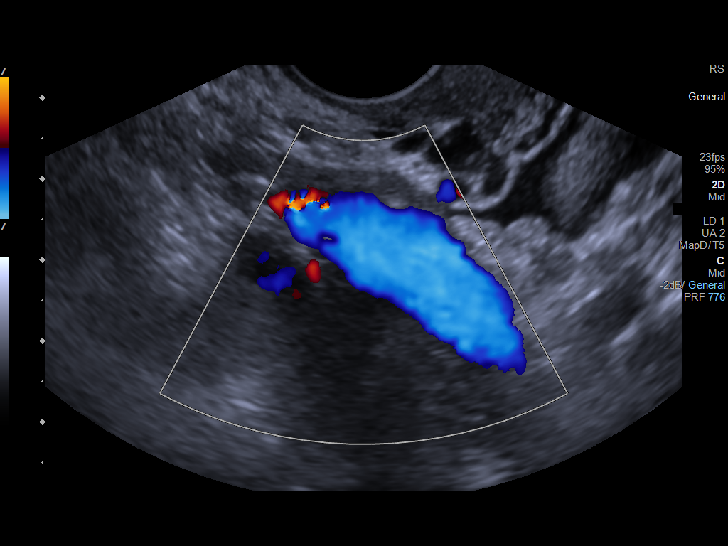

[13 of 25 positions shown; findings below may reference images not displayed]

FINDINGS: Uterus

Measurements: 9.4 x 5 x 5.6 cm = volume: 136 mL. Heterogeneous
without fibroids or other mass visualized.

Endometrium

Thickness: 21.  No focal abnormality visualized.

Right ovary

Measurements: 3.2 x 1.9 x 2.5 cm = volume: 8 mL. Normal
appearance/no adnexal mass.

Left ovary

Measurements: 3.1 x 1.8 x 2.6 cm = volume: 7.6 mL. Normal
appearance/no adnexal mass.

Pulsed Doppler evaluation of both ovaries demonstrates normal
low-resistance arterial and venous waveforms.

Other findings

Small volume free fluid.
IMPRESSION: Small volume free fluid, which is probably physiologic. No mass or
evidence of torsion.

## 2023-07-01 ENCOUNTER — Encounter: Payer: Self-pay | Admitting: Emergency Medicine

## 2023-07-01 ENCOUNTER — Ambulatory Visit
Admission: EM | Admit: 2023-07-01 | Discharge: 2023-07-01 | Disposition: A | Discharge status: Home / Self Care | Payer: Medicaid Other | Attending: Family | Admitting: Family

## 2023-07-01 DIAGNOSIS — L03011 Cellulitis of right finger: Secondary | ICD-10-CM

## 2023-07-01 DIAGNOSIS — M7989 Other specified soft tissue disorders: Secondary | ICD-10-CM | POA: Diagnosis not present

## 2023-07-01 MED ORDER — NAPROXEN 500 MG PO TABS: 500.0000 mg | ORAL_TABLET | Freq: Two times a day (BID) | ORAL | 0 refills | Status: DC | PRN

## 2023-07-01 MED ORDER — DOXYCYCLINE HYCLATE 100 MG PO CAPS: 100.0000 mg | ORAL_CAPSULE | Freq: Two times a day (BID) | ORAL | 0 refills | Status: DC

## 2023-07-01 NOTE — ED Triage Notes (Signed)
 Patient c/o right middle finger pain, swelling and redness since today.  Denies taken any OTC pain meds.  No injury.

## 2023-07-01 NOTE — Discharge Instructions (Signed)
 Recommend start Doxycycline 100mg  twice a day for 7 days- take with food. Also take Naproxen 500mg  every 12 hours as needed for pain and swelling. Start soaking finger in warm water (no hydrogen peroxide) 2 to 3 times a day to help with comfort. If pain and swelling do not improve or get worse, return for recheck.

## 2023-07-01 NOTE — ED Provider Notes (Signed)
 [facility]    CSN: [id number] Arrival date & time: 07/01/23  1832      History   Chief Complaint Chief Complaint  Patient presents with   Finger Injury    HPI Maureen Hensley is a 38 y.o. female.   38 year old female presents with right middle finger swelling and redness along the lateral aspect of her nail.   The history is provided by the patient.    Past Medical History:  Diagnosis Date   Anemia     Patient Active Problem List   Diagnosis Date Noted   Symptomatic anemia 06/30/2018   Iron deficiency anemia 06/30/2018   Menorrhagia 06/30/2018   Viral gastroenteritis 06/30/2018   HA (headache) 06/30/2018    Past Surgical History:  Procedure Laterality Date   CESAREAN SECTION     3   ECTOPIC PREGNANCY SURGERY      OB History   No obstetric history on file.      Home Medications    Prior to Admission medications   Medication Sig Start Date End Date Taking? Authorizing Provider  doxycycline (VIBRAMYCIN) 100 MG capsule Take 1 capsule (100 mg total) by mouth 2 (two) times daily for 7 days. 07/01/23 07/08/23 Yes Su Duma, Ali Lowe, NP  naproxen (NAPROSYN) 500 MG tablet Take 1 tablet (500 mg total) by mouth every 12 (twelve) hours as needed for moderate pain (pain score 4-6) or mild pain (pain score 1-3). 07/01/23  Yes Shwanda Soltis, Ali Lowe, NP    Family History History reviewed. No pertinent family history.  Social History Social History   Tobacco Use   Smoking status: Some Days    Types: Cigarettes   Smokeless tobacco: Never  Vaping Use   Vaping status: Never Used  Substance Use Topics   Alcohol use: Not Currently   Drug use: Not Currently     Allergies   Patient has no known allergies.   Review of Systems Review of Systems   Physical Exam Triage Vital Signs ED Triage Vitals  Encounter Vitals Group     BP 07/01/23 2004 110/71     Systolic BP Percentile --      Diastolic BP Percentile --      Pulse Rate 07/01/23 2004 82     Resp  07/01/23 2004 18     Temp 07/01/23 2004 98 F (36.7 C)     Temp Source 07/01/23 2004 Oral     SpO2 07/01/23 2004 100 %     Weight 07/01/23 2006 135 lb (61.2 kg)     Height 07/01/23 2006 5\' 2"  (1.575 m)     Head Circumference --      Peak Flow --      Pain Score 07/01/23 2005 6     Pain Loc --      Pain Education --      Exclude from Growth Chart --    No data found.  Updated Vital Signs BP 110/71 (BP Location: Right Arm)   Pulse 82   Temp 98 F (36.7 C) (Oral)   Resp 18   Ht 5\' 2"  (1.575 m)   Wt 135 lb (61.2 kg)   LMP 06/27/2023   SpO2 100%   BMI 24.69 kg/m   Visual Acuity Right Eye Distance:   Left Eye Distance:   Bilateral Distance:    Right Eye Near:   Left Eye Near:    Bilateral Near:     Physical Exam   UC Treatments / Results  Labs (all labs ordered are listed, but only abnormal results are displayed) Labs Reviewed - No data to display  EKG   Radiology No results found.  Procedures Procedures (including critical care time)  Medications Ordered in UC Medications - No data to display  Initial Impression / Assessment and Plan / UC Course  I have reviewed the triage vital signs and the nursing notes.  Pertinent labs & imaging results that were available during my care of the patient were reviewed by me and considered in my medical decision making (see chart for details).     *** Final Clinical Impressions(s) / UC Diagnoses   Final diagnoses:  Paronychia of finger of right hand  Swelling of right middle finger     Discharge Instructions      Recommend start Doxycycline 100mg  twice a day for 7 days- take with food. Also take Naproxen 500mg  every 12 hours as needed for pain and swelling. Start soaking finger in warm water (no hydrogen peroxide) 2 to 3 days a day to help with comfort. If pain and swelling do not improve or get worse, return for recheck.     ED Prescriptions     Medication Sig Dispense Auth. Provider   doxycycline  (VIBRAMYCIN) 100 MG capsule Take 1 capsule (100 mg total) by mouth 2 (two) times daily for 7 days. 14 capsule Sudie Grumbling, NP   naproxen (NAPROSYN) 500 MG tablet Take 1 tablet (500 mg total) by mouth every 12 (twelve) hours as needed for moderate pain (pain score 4-6) or mild pain (pain score 1-3). 20 tablet Liel Rudden, Ali Lowe, NP      PDMP not reviewed this encounter.

## 2023-07-04 ENCOUNTER — Observation Stay (HOSPITAL_COMMUNITY)
Admission: EM | Admit: 2023-07-04 | Discharge: 2023-07-06 | Disposition: A | Discharge status: Home / Self Care | Attending: Internal Medicine | Admitting: Internal Medicine

## 2023-07-04 ENCOUNTER — Emergency Department (HOSPITAL_COMMUNITY)

## 2023-07-04 ENCOUNTER — Other Ambulatory Visit: Payer: Self-pay

## 2023-07-04 ENCOUNTER — Encounter (HOSPITAL_COMMUNITY): Payer: Self-pay | Admitting: Emergency Medicine

## 2023-07-04 DIAGNOSIS — R079 Chest pain, unspecified: Secondary | ICD-10-CM | POA: Diagnosis not present

## 2023-07-04 DIAGNOSIS — N920 Excessive and frequent menstruation with regular cycle: Secondary | ICD-10-CM | POA: Diagnosis present

## 2023-07-04 DIAGNOSIS — L02511 Cutaneous abscess of right hand: Secondary | ICD-10-CM | POA: Diagnosis not present

## 2023-07-04 DIAGNOSIS — D62 Acute posthemorrhagic anemia: Secondary | ICD-10-CM | POA: Diagnosis present

## 2023-07-04 DIAGNOSIS — D649 Anemia, unspecified: Secondary | ICD-10-CM | POA: Diagnosis not present

## 2023-07-04 DIAGNOSIS — Z87891 Personal history of nicotine dependence: Secondary | ICD-10-CM | POA: Diagnosis not present

## 2023-07-04 DIAGNOSIS — D75839 Thrombocytosis, unspecified: Secondary | ICD-10-CM | POA: Diagnosis present

## 2023-07-04 DIAGNOSIS — R0789 Other chest pain: Secondary | ICD-10-CM | POA: Diagnosis not present

## 2023-07-04 LAB — CBC
HCT: 22 % — ABNORMAL LOW (ref 36.0–46.0)
Hemoglobin: 5.7 g/dL — CL (ref 12.0–15.0)
MCH: 15.5 pg — ABNORMAL LOW (ref 26.0–34.0)
MCHC: 25.9 g/dL — ABNORMAL LOW (ref 30.0–36.0)
MCV: 59.9 fL — ABNORMAL LOW (ref 80.0–100.0)
Platelets: 765 10*3/uL — ABNORMAL HIGH (ref 150–400)
RBC: 3.67 MIL/uL — ABNORMAL LOW (ref 3.87–5.11)
RDW: 27.5 % — ABNORMAL HIGH (ref 11.5–15.5)
WBC: 7 10*3/uL (ref 4.0–10.5)
nRBC: 0 % (ref 0.0–0.2)

## 2023-07-04 LAB — HCG, SERUM, QUALITATIVE: Preg, Serum: NEGATIVE

## 2023-07-04 LAB — BASIC METABOLIC PANEL
Anion gap: 12 (ref 5–15)
BUN: 9 mg/dL (ref 6–20)
CO2: 20 mmol/L — ABNORMAL LOW (ref 22–32)
Calcium: 8.9 mg/dL (ref 8.9–10.3)
Chloride: 105 mmol/L (ref 98–111)
Creatinine, Ser: 0.63 mg/dL (ref 0.44–1.00)
GFR, Estimated: >60 mL/min (ref >60)
Glucose, Bld: 112 mg/dL — ABNORMAL HIGH (ref 70–99)
Potassium: 3.5 mmol/L (ref 3.5–5.1)
Sodium: 137 mmol/L (ref 135–145)

## 2023-07-04 LAB — TROPONIN I (HIGH SENSITIVITY): Troponin I (High Sensitivity): <2 ng/L (ref <18)

## 2023-07-04 LAB — PREPARE RBC (CROSSMATCH)

## 2023-07-04 MED ORDER — SODIUM CHLORIDE 0.9% IV SOLUTION: Freq: Once | INTRAVENOUS | Status: AC

## 2023-07-04 MED ORDER — SODIUM CHLORIDE 0.9% IV SOLUTION
Freq: Once | INTRAVENOUS | Status: AC
Start: 2023-07-04 — End: 2023-07-05

## 2023-07-04 MED ORDER — IBUPROFEN 800 MG PO TABS
800.0000 mg | ORAL_TABLET | Freq: Once | ORAL | Status: AC
  Administered 2023-07-04: 800 mg via ORAL
  Filled 2023-07-04: qty 1

## 2023-07-04 NOTE — H&P (Signed)
 [name]:[id number] DOB: [birth year] DOA: 07/04/2023     PCP: Patient, No Pcp Per   Outpatient Specialists: NONE   Patient arrived to ER on 07/04/23 at 2101 Referred by Attending No att. providers found   Patient coming from:    home Lives   With family    Chief Complaint:   Chief Complaint  Patient presents with   Chest Pain    HPI: Maureen Hensley is a 38 y.o. female with medical history significant of endometriosis,, menorrhagia, symptomatic anemia    Presented with chest pain Pt presents with CP for the past 2 days  Hx of anemia   HG found to be down to 5.7 Hx of endometriosis and irregular periods  Had to require blood transfusions in the past   Denies any blood per rectum no blood in urine.  Reports getting regular menstrual periods once a month sometimes a heavy  Denies significant ETOH intake   Does not smoke   Reports chest pain ongoing but reproducible by palpation sharp noted in the small area around the sternum  No results found for: "SARSCOV2NAA"      Regarding pertinent Chronic problems:   Chronic anemia - baseline hg Hemoglobin & Hematocrit  Recent Labs    07/04/23 2112  HGB 5.7*   Iron/TIBC/Ferritin/ %Sat    Component Value Date/Time   IRON 8 (L) 06/30/2018 0253   TIBC 458 (H) 06/30/2018 0253   FERRITIN 1 (L) 06/30/2018 0253   IRONPCTSAT 2 (L) 06/30/2018 0253    While in ER:   Patient will be transfused 2 units follow CBC     Lab Orders         Basic metabolic panel         CBC         hCG, serum, qualitative         Vitamin B12         Folate         Iron and TIBC         Ferritin         Reticulocytes       CXR -  NON acute    Following Medications were ordered in ER: Medications  0.9 %  sodium chloride infusion (Manually program via Guardrails IV Fluids) (has no administration in time range)  ibuprofen (ADVIL) tablet 800 mg (800 mg Oral Given 07/04/23 2306)       ED Triage Vitals  Encounter Vitals Group     BP  07/04/23 2109 118/70     Systolic BP Percentile --      Diastolic BP Percentile --      Pulse Rate 07/04/23 2109 96     Resp 07/04/23 2109 16     Temp 07/04/23 2109 98.5 F (36.9 C)     Temp src --      SpO2 07/04/23 2109 100 %     Weight 07/04/23 2110 135 lb (61.2 kg)     Height 07/04/23 2110 5\' 2"  (1.575 m)     Head Circumference --      Peak Flow --      Pain Score 07/04/23 2110 10     Pain Loc --      Pain Education --      Exclude from Growth Chart --   NFAO(13)@     _________________________________________ Significant initial  Findings: Abnormal Labs Reviewed  BASIC METABOLIC PANEL - Abnormal; Notable for the following components:  Result Value   CO2 20 (*)    Glucose, Bld 112 (*)    All other components within normal limits  CBC - Abnormal; Notable for the following components:   RBC 3.67 (*)    Hemoglobin 5.7 (*)    HCT 22.0 (*)    MCV 59.9 (*)    MCH 15.5 (*)    MCHC 25.9 (*)    RDW 27.5 (*)    Platelets 765 (*)    All other components within normal limits      _________________________ Troponin  ordered Cardiac Panel (last 3 results) Recent Labs    07/04/23 2112  TROPONINIHS <2    ECG: Ordered Personally reviewed and interpreted by me showing: HR : 90 Rhythm: Normal sinus rhythm Nonspecific ST and T wave abnormality Abnormal ECG QTC 445    ____________________ This patient meets SIRS Criteria and may be septic    The recent clinical data is shown below. Vitals:   07/04/23 2109 07/04/23 2110 07/04/23 2226 07/04/23 2245  BP: 118/70   98/65  Pulse: 96   70  Resp: 16   17  Temp: 98.5 F (36.9 C)     SpO2: 100%  100% 100%  Weight:  61.2 kg    Height:  5\' 2"  (1.575 m)      WBC     Component Value Date/Time   WBC 7.0 07/04/2023 2112       UA  ordered     Results for orders placed or performed during the hospital encounter of 03/08/19  Wet prep, genital     Status: Abnormal   Collection Time: 03/08/19  3:45 PM   Specimen:  Vaginal  Result Value Ref Range Status   Yeast Wet Prep HPF POC NONE SEEN NONE SEEN Final   Trich, Wet Prep NONE SEEN NONE SEEN Final   Clue Cells Wet Prep HPF POC NONE SEEN NONE SEEN Final   WBC, Wet Prep HPF POC FEW (A) NONE SEEN Final   Sperm NONE SEEN  Final    Comment: Performed at [facility], [address], [place], [place] [address]    ______________________________________________________ Recent Labs  Lab 07/04/23 2112  NA 137  K 3.5  CO2 20*  GLUCOSE 112*  BUN 9  CREATININE 0.63  CALCIUM 8.9    Cr    stable,   Lab Results  Component Value Date   CREATININE 0.63 07/04/2023   CREATININE 0.74 03/08/2019   CREATININE 0.68 06/29/2018    No results for input(s): "AST", "ALT", "ALKPHOS", "BILITOT", "PROT", "ALBUMIN" in the last 168 hours. Lab Results  Component Value Date   CALCIUM 8.9 07/04/2023    Plt: Lab Results  Component Value Date   PLT 765 (H) 07/04/2023       Recent Labs  Lab 07/04/23 2112  WBC 7.0  HGB 5.7*  HCT 22.0*  MCV 59.9*  PLT 765*    HG/HCT   Down  from baseline see below    Component Value Date/Time   HGB 5.7 (LL) 07/04/2023 2112   HCT 22.0 (L) 07/04/2023 2112   MCV 59.9 (L) 07/04/2023 2112     _______________________________________________ Hospitalist was called for admission for   Symptomatic anemia     The following Work up has been ordered so far:  Orders Placed This Encounter  Procedures   DG Chest 2 View   Basic metabolic panel   CBC   hCG, serum, qualitative   Vitamin B12   Folate  Iron and TIBC   Ferritin   Reticulocytes   Document Height and Actual Weight   Informed Consent Details: Physician/Practitioner Attestation; Transcribe to consent form and obtain patient signature   Consult for [facility] Admission   ED EKG   EKG 12-Lead   Type and screen   Prepare RBC (crossmatch)     OTHER Significant initial  Findings:  labs showing:          Cultures: No results found for: "SDES",  "SPECREQUEST", "CULT", "REPTSTATUS"   Radiological Exams on Admission: DG Chest 2 View Result Date: 07/04/2023 CLINICAL DATA:  CP EXAM: CHEST - 2 VIEW COMPARISON:  None Available. FINDINGS: The heart and mediastinal contours are within normal limits. No focal consolidation. No pulmonary edema. No pleural effusion. No pneumothorax. No acute osseous abnormality. IMPRESSION: No active cardiopulmonary disease. Electronically Signed   By: Tish Frederickson M.D.   On: 07/04/2023 21:37   _______________________________________________________________________________________________________ Latest  Blood pressure 98/65, pulse 70, temperature 98.5 F (36.9 C), resp. rate 17, height 5\' 2"  (1.575 m), weight 61.2 kg, last menstrual period 06/27/2023, SpO2 100%.   Vitals  labs and radiology finding personally reviewed  Review of Systems:    Pertinent positives include:   chest pain,  Constitutional:  No weight loss, night sweats, Fevers, chills, fatigue, weight loss  HEENT:  No headaches, Difficulty swallowing,Tooth/dental problems,Sore throat,  No sneezing, itching, ear ache, nasal congestion, post nasal drip,  Cardio-vascular:  No Orthopnea, PND, anasarca, dizziness, palpitations.no Bilateral lower extremity swelling  GI:  No heartburn, indigestion, abdominal pain, nausea, vomiting, diarrhea, change in bowel habits, loss of appetite, melena, blood in stool, hematemesis Resp:  no shortness of breath at rest. No dyspnea on exertion, No excess mucus, no productive cough, No non-productive cough, No coughing up of blood.No change in color of mucus.No wheezing. Skin:  no rash or lesions. No jaundice GU:  no dysuria, change in color of urine, no urgency or frequency. No straining to urinate.  No flank pain.  Musculoskeletal:  No joint pain or no joint swelling. No decreased range of motion. No back pain.  Psych:  No change in mood or affect. No depression or anxiety. No memory loss.  Neuro: no  localizing neurological complaints, no tingling, no weakness, no double vision, no gait abnormality, no slurred speech, no confusion  All systems reviewed and apart from HOPI all are negative _______________________________________________________________________________________________ Past Medical History:   Past Medical History:  Diagnosis Date   Anemia       Past Surgical History:  Procedure Laterality Date   CESAREAN SECTION     3   ECTOPIC PREGNANCY SURGERY      Social History:  Ambulatory   independently     reports that she has quit smoking. Her smoking use included cigarettes. She has never used smokeless tobacco. She reports that she does not currently use alcohol. She reports that she does not currently use drugs.     Family History:   History reviewed. No pertinent family history. ______________________________________________________________________________________________ Allergies: No Known Allergies   Prior to Admission medications   Medication Sig Start Date End Date Taking? Authorizing Provider  doxycycline (VIBRAMYCIN) 100 MG capsule Take 1 capsule (100 mg total) by mouth 2 (two) times daily for 7 days. 07/01/23 07/08/23  Sudie Grumbling, NP  naproxen (NAPROSYN) 500 MG tablet Take 1 tablet (500 mg total) by mouth every 12 (twelve) hours as needed for moderate pain (pain score 4-6) or mild pain (pain score 1-3). 07/01/23  Sudie Grumbling, NP    ___________________________________________________________________________________________________ Physical Exam:    07/04/2023   10:45 PM 07/04/2023    9:10 PM 07/04/2023    9:09 PM  Vitals with BMI  Height  5\' 2"    Weight  135 lbs   BMI  24.69   Systolic 98  118  Diastolic 65  70  Pulse 70  96    1. General:  in No  Acute distress   well   -appearing 2. Psychological: Alert and   Oriented 3. Head/ENT:   Moist  pale Mucous Membranes                          Head Non traumatic, neck supple                           Normal   Dentition 4. SKIN: decreased Skin turgor,  Skin clean Dry and intact no rash    5. Heart: Regular rate and rhythm no  Murmur, no Rub or gallop 6. Lungs:  no wheezes or crackles   7. Abdomen: Soft,  non-tender, Non distended bowel sounds present 8. Lower extremities: no clubbing, cyanosis, no  edema 9. Neurologically Grossly intact, moving all 4 extremities equally   10. MSK: Normal range of motion   Chest pain reproducible  by palpation  Chart has been reviewed  ______________________________________________________________________________________________  Assessment/Plan female with medical history significant of endometriosis,, menorrhagia, symptomatic anemia  Admitted for   Symptomatic anemia and chest pain    Present on Admission:  Symptomatic anemia  Menorrhagia  Chest pain  Thrombocytosis    Symptomatic anemia Admit and transfuse 2 units Follow CBC    Menorrhagia Need to follow up with GYN  Chest pain Atypical in the setting o symptomatic anemia Trop WNL Given hx of of persistent anemia will obtain echo for completion   Thrombocytosis In the setting of chronic anemia   Other plan as per orders.  DVT prophylaxis:  SCD     Code Status:    Code Status: Prior FULL CODE  as per patient   I had personally discussed CODE STATUS with patient   ACP   none   Family Communication:   Family not at  Bedside    Diet regular   Disposition Plan:     To home once workup is complete and patient is stable   Following barriers for discharge:                             Chest pain  work up is complete                                                          Anemia corrected h/H stable                           Consults called: none   Admission status:  ED Disposition     ED Disposition  Admit   Condition  --   Comment  Hospital Area: [facility] [[id number]]  Level of Care: Progressive [102]  Admit to Progressive based on  following criteria: MULTISYSTEM THREATS such  as stable sepsis, metabolic/electrolyte imbalance with or without encephalopathy that is responding to early treatment.  May place patient in observation at [facility] or [facility] if equivalent level of care is available:: No  Covid Evaluation: Asymptomatic - no recent exposure (last 10 days) testing not required  Diagnosis: Symptomatic anemia [[id number]]  Admitting Physician: Therisa Doyne [[id number]]  Attending Physician: Therisa Doyne [[id number]]          Obs    Level of care    progressive       tele indefinitely please discontinue once patient no longer qualifies COVID-19 Labs   Nikolaus Pienta 07/05/2023, 12:06 AM    [facility]     after 2 AM please page floor coverage PA If 7AM-7PM, please contact the day team taking care of the patient using [email or web address]

## 2023-07-04 NOTE — ED Notes (Signed)
 CCMD called.

## 2023-07-04 NOTE — ED Triage Notes (Signed)
 Pt c/o centralized sharp-like chest pain that started on Thursday. Pain worsening with movement. No radiation. No known cardiac hx.

## 2023-07-04 NOTE — Assessment & Plan Note (Signed)
Need to follow up with GYN

## 2023-07-04 NOTE — Assessment & Plan Note (Addendum)
 Atypical in the setting o symptomatic anemia Trop WNL Given hx of of persistent anemia will obtain echo for completion

## 2023-07-04 NOTE — ED Provider Notes (Signed)
 [place] EMERGENCY DEPARTMENT AT [facility] Provider Note   CSN: [id number] Arrival date & time: 07/04/23  2101     History  Chief Complaint  Patient presents with   Chest Pain    Maureen Hensley is a 38 y.o. female.  The history is provided by the patient and medical records.  Chest Pain Associated symptoms: fatigue    38 year old female with history of iron deficiency anemia, menorrhagia secondary to endometriosis, presenting to the ED for chest pain.  Midsternal in location without radiation.  Described as sharp and stabbing, seems to be intermittent.  No real alleviating or exacerbating factors.  She does not have any shortness of breath or pain with deep breathing.  No cough, chills, sweats, or fever.  She has not had any sick contacts.  Does admit to feeling weak and fatigued recently.  She has not had any syncopal events.  Home Medications Prior to Admission medications   Medication Sig Start Date End Date Taking? Authorizing Provider  doxycycline (VIBRAMYCIN) 100 MG capsule Take 1 capsule (100 mg total) by mouth 2 (two) times daily for 7 days. 07/01/23 07/08/23  Sudie Grumbling, NP  naproxen (NAPROSYN) 500 MG tablet Take 1 tablet (500 mg total) by mouth every 12 (twelve) hours as needed for moderate pain (pain score 4-6) or mild pain (pain score 1-3). 07/01/23   Amyot, Ali Lowe, NP      Allergies    Patient has no known allergies.    Review of Systems   Review of Systems  Constitutional:  Positive for fatigue.  Cardiovascular:  Positive for chest pain.  All other systems reviewed and are negative.   Physical Exam Updated Vital Signs BP 118/70   Pulse 96   Temp 98.5 F (36.9 C)   Resp 16   Ht 5\' 2"  (1.575 m)   Wt 61.2 kg   LMP 06/27/2023   SpO2 100%   BMI 24.69 kg/m   Physical Exam Vitals and nursing note reviewed.  Constitutional:      Appearance: She is well-developed.     Comments: Seems pale in color  HENT:     Head: Normocephalic and  atraumatic.  Eyes:     Conjunctiva/sclera: Conjunctivae normal.     Pupils: Pupils are equal, round, and reactive to light.  Cardiovascular:     Rate and Rhythm: Normal rate and regular rhythm.     Heart sounds: Normal heart sounds.  Pulmonary:     Effort: Pulmonary effort is normal.     Breath sounds: Normal breath sounds.  Abdominal:     General: Bowel sounds are normal.     Palpations: Abdomen is soft.  Musculoskeletal:        General: Normal range of motion.     Cervical back: Normal range of motion.  Skin:    General: Skin is warm and dry.  Neurological:     Mental Status: She is alert and oriented to person, place, and time.     ED Results / Procedures / Treatments   Labs (all labs ordered are listed, but only abnormal results are displayed) Labs Reviewed  BASIC METABOLIC PANEL - Abnormal; Notable for the following components:      Result Value   CO2 20 (*)    Glucose, Bld 112 (*)    All other components within normal limits  CBC - Abnormal; Notable for the following components:   RBC 3.67 (*)    Hemoglobin 5.7 (*)  HCT 22.0 (*)    MCV 59.9 (*)    MCH 15.5 (*)    MCHC 25.9 (*)    RDW 27.5 (*)    Platelets 765 (*)    All other components within normal limits  HCG, SERUM, QUALITATIVE  TROPONIN I (HIGH SENSITIVITY)  TROPONIN I (HIGH SENSITIVITY)    EKG None  Radiology DG Chest 2 View Result Date: 07/04/2023 CLINICAL DATA:  CP EXAM: CHEST - 2 VIEW COMPARISON:  None Available. FINDINGS: The heart and mediastinal contours are within normal limits. No focal consolidation. No pulmonary edema. No pleural effusion. No pneumothorax. No acute osseous abnormality. IMPRESSION: No active cardiopulmonary disease. Electronically Signed   By: Tish Frederickson M.D.   On: 07/04/2023 21:37    Procedures Procedures    CRITICAL CARE Performed by: Garlon Hatchet   Total critical care time: 45 minutes  Critical care time was exclusive of separately billable procedures  and treating other patients.  Critical care was necessary to treat or prevent imminent or life-threatening deterioration.  Critical care was time spent personally by me on the following activities: development of treatment plan with patient and/or surrogate as well as nursing, discussions with consultants, evaluation of patient's response to treatment, examination of patient, obtaining history from patient or surrogate, ordering and performing treatments and interventions, ordering and review of laboratory studies, ordering and review of radiographic studies, pulse oximetry and re-evaluation of patient's condition.   Medications Ordered in ED Medications  0.9 %  sodium chloride infusion (Manually program via Guardrails IV Fluids) (has no administration in time range)  0.9 %  sodium chloride infusion (Manually program via Guardrails IV Fluids) (has no administration in time range)  ibuprofen (ADVIL) tablet 800 mg (800 mg Oral Given 07/04/23 2306)    ED Course/ Medical Decision Making/ A&P                                 Medical Decision Making Amount and/or Complexity of Data Reviewed Labs: ordered. Radiology: ordered and independent interpretation performed. ECG/medicine tests: ordered and independent interpretation performed.  Risk Prescription drug management. Decision regarding hospitalization.  38 y.o. F here with chest pain.  Intermittent x 2 days, mid-sternal without radiation.  No SOB, fever, cough, URI symptoms.  Has been feeling weak/fatigued.    Labs from triage-- Hemoglobin today 5.7.  Similar values previously.  Did recently have a menstrual cycle.  She is still taking her iron supplementation.  She denies any rectal bleeding/bloody stools.  No electrolyte derangement.  Troponin negative.  Chest x-ray is clear.  EKG without acute ischemic changes.  Patient has baseline hemoglobin of around 8-9.  She has never been this low before.  She will need transfusion.  I discussed  options of inpatient admission versus transfusion in ER, she would prefer admission since she will likely be here for many hours.  This seems reasonable.  In regards to the chest pain, symptoms are quite atypical.  She does not have any pleuritic component.  No tachycardia or hypoxia.  Not currently on OCPs.  Lower suspicion for PE.  Anemia panel has been sent.  Will transfuse 2 units.  Discussed with Dr. Adela Glimpse-- will admit for ongoing care.  Final Clinical Impression(s) / ED Diagnoses Final diagnoses:  Symptomatic anemia    Rx / DC Orders ED Discharge Orders     None         Garlon Hatchet,  PA-C 07/04/23 2344    Charlynne Pander, MD 07/05/23 [phone]

## 2023-07-04 NOTE — Subjective & Objective (Signed)
 Pt presents with CP for the past 2 days  Hx of anemia   HG found to be down to 5.7 Hx of endometriosis and irregular periods  Had to require blood transfusions in the past

## 2023-07-04 NOTE — H&P (Incomplete)
 Maureen Hensley WJX:[id number] DOB: 1985/06/03 DOA: 07/04/2023     PCP: Patient, No Pcp Per   Outpatient Specialists: NONE   Patient arrived to ER on 07/04/23 at 2101 Referred by Attending No att. providers found   Patient coming from:    home Lives   With family    Chief Complaint:   Chief Complaint  Patient presents with  . Chest Pain    HPI: Maureen Hensley is a 38 y.o. female with medical history significant of endometriosis,, menorrhagia, symptomatic anemia    Presented with chest pain Pt presents with CP for the past 2 days  Hx of anemia   HG found to be down to 5.7 Hx of endometriosis and irregular periods  Had to require blood transfusions in the past   Denies any blood per rectum no blood in urine.  Reports getting regular menstrual periods once a month sometimes a heavy  Denies significant ETOH intake   Does not smoke   Reports chest pain ongoing but reproducible by palpation sharp noted in the small area around the sternum  No results found for: "SARSCOV2NAA"      Regarding pertinent Chronic problems:   Chronic anemia - baseline hg Hemoglobin & Hematocrit  Recent Labs    07/04/23 2112  HGB 5.7*   Iron/TIBC/Ferritin/ %Sat    Component Value Date/Time   IRON 8 (L) 06/30/2018 0253   TIBC 458 (H) 06/30/2018 0253   FERRITIN 1 (L) 06/30/2018 0253   IRONPCTSAT 2 (L) 06/30/2018 0253    While in ER:   Patient will be transfused 2 units follow CBC     Lab Orders         Basic metabolic panel         CBC         hCG, serum, qualitative         Vitamin B12         Folate         Iron and TIBC         Ferritin         Reticulocytes       CXR -  NON acute    Following Medications were ordered in ER: Medications  0.9 %  sodium chloride infusion (Manually program via Guardrails IV Fluids) (has no administration in time range)  ibuprofen (ADVIL) tablet 800 mg (800 mg Oral Given 07/04/23 2306)       ED Triage Vitals  Encounter Vitals Group      BP 07/04/23 2109 118/70     Systolic BP Percentile --      Diastolic BP Percentile --      Pulse Rate 07/04/23 2109 96     Resp 07/04/23 2109 16     Temp 07/04/23 2109 98.5 F (36.9 C)     Temp src --      SpO2 07/04/23 2109 100 %     Weight 07/04/23 2110 135 lb (61.2 kg)     Height 07/04/23 2110 5\' 2"  (1.575 m)     Head Circumference --      Peak Flow --      Pain Score 07/04/23 2110 10     Pain Loc --      Pain Education --      Exclude from Growth Chart --   OZHY(86)@     _________________________________________ Significant initial  Findings: Abnormal Labs Reviewed  BASIC METABOLIC PANEL - Abnormal; Notable for the following components:  Result Value   CO2 20 (*)    Glucose, Bld 112 (*)    All other components within normal limits  CBC - Abnormal; Notable for the following components:   RBC 3.67 (*)    Hemoglobin 5.7 (*)    HCT 22.0 (*)    MCV 59.9 (*)    MCH 15.5 (*)    MCHC 25.9 (*)    RDW 27.5 (*)    Platelets 765 (*)    All other components within normal limits      _________________________ Troponin  ordered Cardiac Panel (last 3 results) Recent Labs    07/04/23 2112  TROPONINIHS <2    ECG: Ordered Personally reviewed and interpreted by me showing: HR : 90 Rhythm: Normal sinus rhythm Nonspecific ST and T wave abnormality Abnormal ECG QTC 445    ____________________ This patient meets SIRS Criteria and may be septic    The recent clinical data is shown below. Vitals:   07/04/23 2109 07/04/23 2110 07/04/23 2226 07/04/23 2245  BP: 118/70   98/65  Pulse: 96   70  Resp: 16   17  Temp: 98.5 F (36.9 C)     SpO2: 100%  100% 100%  Weight:  61.2 kg    Height:  5\' 2"  (1.575 m)      WBC     Component Value Date/Time   WBC 7.0 07/04/2023 2112       UA  ordered     Results for orders placed or performed during the hospital encounter of 03/08/19  Wet prep, genital     Status: Abnormal   Collection Time: 03/08/19  3:45 PM   Specimen:  Vaginal  Result Value Ref Range Status   Yeast Wet Prep HPF POC NONE SEEN NONE SEEN Final   Trich, Wet Prep NONE SEEN NONE SEEN Final   Clue Cells Wet Prep HPF POC NONE SEEN NONE SEEN Final   WBC, Wet Prep HPF POC FEW (A) NONE SEEN Final   Sperm NONE SEEN  Final    Comment: Performed at [facility], [address] [place], [place] [address]    ______________________________________________________ Recent Labs  Lab 07/04/23 2112  NA 137  K 3.5  CO2 20*  GLUCOSE 112*  BUN 9  CREATININE 0.63  CALCIUM 8.9    Cr    stable,   Lab Results  Component Value Date   CREATININE 0.63 07/04/2023   CREATININE 0.74 03/08/2019   CREATININE 0.68 06/29/2018    No results for input(s): "AST", "ALT", "ALKPHOS", "BILITOT", "PROT", "ALBUMIN" in the last 168 hours. Lab Results  Component Value Date   CALCIUM 8.9 07/04/2023    Plt: Lab Results  Component Value Date   PLT 765 (H) 07/04/2023       Recent Labs  Lab 07/04/23 2112  WBC 7.0  HGB 5.7*  HCT 22.0*  MCV 59.9*  PLT 765*    HG/HCT   Down  from baseline see below    Component Value Date/Time   HGB 5.7 (LL) 07/04/2023 2112   HCT 22.0 (L) 07/04/2023 2112   MCV 59.9 (L) 07/04/2023 2112     _______________________________________________ Hospitalist was called for admission for   Symptomatic anemia     The following Work up has been ordered so far:  Orders Placed This Encounter  Procedures  . DG Chest 2 View  . Basic metabolic panel  . CBC  . hCG, serum, qualitative  . Vitamin B12  . Folate  .  Iron and TIBC  . Ferritin  . Reticulocytes  . Document Height and Actual Weight  . Informed Consent Details: Physician/Practitioner Attestation; Transcribe to consent form and obtain patient signature  . Consult for [facility] Admission  . ED EKG  . EKG 12-Lead  . Type and screen  . Prepare RBC (crossmatch)     OTHER Significant initial  Findings:  labs showing:          Cultures: No results  found for: "SDES", "SPECREQUEST", "CULT", "REPTSTATUS"   Radiological Exams on Admission: DG Chest 2 View Result Date: 07/04/2023 CLINICAL DATA:  CP EXAM: CHEST - 2 VIEW COMPARISON:  None Available. FINDINGS: The heart and mediastinal contours are within normal limits. No focal consolidation. No pulmonary edema. No pleural effusion. No pneumothorax. No acute osseous abnormality. IMPRESSION: No active cardiopulmonary disease. Electronically Signed   By: Tish Frederickson M.D.   On: 07/04/2023 21:37   _______________________________________________________________________________________________________ Latest  Blood pressure 98/65, pulse 70, temperature 98.5 F (36.9 C), resp. rate 17, height 5\' 2"  (1.575 m), weight 61.2 kg, last menstrual period 06/27/2023, SpO2 100%.   Vitals  labs and radiology finding personally reviewed  Review of Systems:    Pertinent positives include:   chest pain,  Constitutional:  No weight loss, night sweats, Fevers, chills, fatigue, weight loss  HEENT:  No headaches, Difficulty swallowing,Tooth/dental problems,Sore throat,  No sneezing, itching, ear ache, nasal congestion, post nasal drip,  Cardio-vascular:  No Orthopnea, PND, anasarca, dizziness, palpitations.no Bilateral lower extremity swelling  GI:  No heartburn, indigestion, abdominal pain, nausea, vomiting, diarrhea, change in bowel habits, loss of appetite, melena, blood in stool, hematemesis Resp:  no shortness of breath at rest. No dyspnea on exertion, No excess mucus, no productive cough, No non-productive cough, No coughing up of blood.No change in color of mucus.No wheezing. Skin:  no rash or lesions. No jaundice GU:  no dysuria, change in color of urine, no urgency or frequency. No straining to urinate.  No flank pain.  Musculoskeletal:  No joint pain or no joint swelling. No decreased range of motion. No back pain.  Psych:  No change in mood or affect. No depression or anxiety. No memory loss.   Neuro: no localizing neurological complaints, no tingling, no weakness, no double vision, no gait abnormality, no slurred speech, no confusion  All systems reviewed and apart from HOPI all are negative _______________________________________________________________________________________________ Past Medical History:   Past Medical History:  Diagnosis Date  . Anemia       Past Surgical History:  Procedure Laterality Date  . CESAREAN SECTION     3  . ECTOPIC PREGNANCY SURGERY      Social History:  Ambulatory   independently     reports that she has quit smoking. Her smoking use included cigarettes. She has never used smokeless tobacco. She reports that she does not currently use alcohol. She reports that she does not currently use drugs.     Family History:   History reviewed. No pertinent family history. ______________________________________________________________________________________________ Allergies: No Known Allergies   Prior to Admission medications   Medication Sig Start Date End Date Taking? Authorizing Provider  doxycycline (VIBRAMYCIN) 100 MG capsule Take 1 capsule (100 mg total) by mouth 2 (two) times daily for 7 days. 07/01/23 07/08/23  Sudie Grumbling, NP  naproxen (NAPROSYN) 500 MG tablet Take 1 tablet (500 mg total) by mouth every 12 (twelve) hours as needed for moderate pain (pain score 4-6) or mild pain (pain score 1-3). 07/01/23  Sudie Grumbling, NP    ___________________________________________________________________________________________________ Physical Exam:    07/04/2023   10:45 PM 07/04/2023    9:10 PM 07/04/2023    9:09 PM  Vitals with BMI  Height  5\' 2"    Weight  135 lbs   BMI  24.69   Systolic 98  118  Diastolic 65  70  Pulse 70  96     1. General:  in No  Acute distress   well   -appearing 2. Psychological: Alert and   Oriented 3. Head/ENT:   Moist  pale Mucous Membranes                          Head Non traumatic, neck  supple                          Normal   Dentition 4. SKIN: decreased Skin turgor,  Skin clean Dry and intact no rash    5. Heart: Regular rate and rhythm no  Murmur, no Rub or gallop 6. Lungs:  no wheezes or crackles   7. Abdomen: Soft,  non-tender, Non distended bowel sounds present 8. Lower extremities: no clubbing, cyanosis, no  edema 9. Neurologically Grossly intact, moving all 4 extremities equally   10. MSK: Normal range of motion   Chest pain reproducible  by palpation  Chart has been reviewed  ______________________________________________________________________________________________  Assessment/Plan female with medical history significant of endometriosis,, menorrhagia, symptomatic anemia  Admitted for   Symptomatic anemia and chest pain    Present on Admission: . Symptomatic anemia . Menorrhagia . Chest pain     Symptomatic anemia Admit and transfuse 2 units Follow CBC    Menorrhagia Need to follow up with GYN  Chest pain Atypical in the setting o symptomatic anemia Trop WNL Given hx of of persistent anemia will obtain echo for completion    Other plan as per orders.  DVT prophylaxis:  SCD     Code Status:    Code Status: Prior FULL CODE  as per patient   I had personally discussed CODE STATUS with patient   ACP   none   Family Communication:   Family not at  Bedside  plan of care was discussed on the phone with *** Son, Daughter, Wife, Husband, Sister, Brother , father, mother  Diet regular   Disposition Plan:     To home once workup is complete and patient is stable   Following barriers for discharge:                             Chest pain  work up is complete                                                          Anemia corrected h/H stable                           Consults called: none   Admission status:  ED Disposition     ED Disposition  Admit   Condition  --   Comment  Hospital Area: [facility]  [[id number]]  Level of Care: Progressive [102]  Admit to Progressive based on following criteria: MULTISYSTEM THREATS such as stable sepsis, metabolic/electrolyte imbalance with or without encephalopathy that is responding to early treatment.  May place patient in observation at [facility] or [facility] if equivalent level of care is available:: No  Covid Evaluation: Asymptomatic - no recent exposure (last 10 days) testing not required  Diagnosis: Symptomatic anemia [[id number]]  Admitting Physician: Therisa Doyne [[id number]]  Attending Physician: Therisa Doyne [[id number]]           Obs    Level of care    progressive       tele indefinitely please discontinue once patient no longer qualifies COVID-19 Labs     Kana Reimann 07/04/2023, 11:39 PM ***  [facility]     after 2 AM please page floor coverage PA If 7AM-7PM, please contact the day team taking care of the patient using [email or web address]

## 2023-07-04 NOTE — ED Notes (Signed)
 Increased pts acuity to 2 following critical hgb 5.7

## 2023-07-04 NOTE — Assessment & Plan Note (Signed)
 Admit and transfuse 2 units Follow CBC

## 2023-07-04 NOTE — Assessment & Plan Note (Signed)
 In the setting of chronic anemia

## 2023-07-05 ENCOUNTER — Observation Stay (HOSPITAL_BASED_OUTPATIENT_CLINIC_OR_DEPARTMENT_OTHER)

## 2023-07-05 DIAGNOSIS — D649 Anemia, unspecified: Secondary | ICD-10-CM | POA: Diagnosis not present

## 2023-07-05 DIAGNOSIS — R079 Chest pain, unspecified: Secondary | ICD-10-CM

## 2023-07-05 LAB — ECHOCARDIOGRAM COMPLETE
AR max vel: 3.75 cm2
AV Peak grad: 5.1 mmHg
Ao pk vel: 1.13 m/s
Area-P 1/2: 3.77 cm2
Height: 62 in
S' Lateral: 3 cm
Weight: 2160 [oz_av]

## 2023-07-05 LAB — PHOSPHORUS: Phosphorus: 4.6 mg/dL (ref 2.5–4.6)

## 2023-07-05 LAB — CBC WITH DIFFERENTIAL/PLATELET
Abs Immature Granulocytes: 0.02 10*3/uL (ref 0.00–0.07)
Abs Immature Granulocytes: 0 10*3/uL (ref 0.00–0.07)
Basophils Absolute: 0.1 10*3/uL (ref 0.0–0.1)
Basophils Absolute: 0.1 10*3/uL (ref 0.0–0.1)
Basophils Relative: 1 %
Basophils Relative: 1 %
Eosinophils Absolute: 0.1 10*3/uL (ref 0.0–0.5)
Eosinophils Absolute: 0.2 10*3/uL (ref 0.0–0.5)
Eosinophils Relative: 2 %
Eosinophils Relative: 3 %
HCT: 26.5 % — ABNORMAL LOW (ref 36.0–46.0)
HCT: 28.3 % — ABNORMAL LOW (ref 36.0–46.0)
Hemoglobin: 7.1 g/dL — ABNORMAL LOW (ref 12.0–15.0)
Hemoglobin: 8 g/dL — ABNORMAL LOW (ref 12.0–15.0)
Immature Granulocytes: 0 %
Lymphocytes Relative: 25 %
Lymphocytes Relative: 35 %
Lymphs Abs: 1.6 10*3/uL (ref 0.7–4.0)
Lymphs Abs: 1.8 10*3/uL (ref 0.7–4.0)
MCH: 17 pg — ABNORMAL LOW (ref 26.0–34.0)
MCH: 18.8 pg — ABNORMAL LOW (ref 26.0–34.0)
MCHC: 26.8 g/dL — ABNORMAL LOW (ref 30.0–36.0)
MCHC: 28.3 g/dL — ABNORMAL LOW (ref 30.0–36.0)
MCV: 63.5 fL — ABNORMAL LOW (ref 80.0–100.0)
MCV: 66.4 fL — ABNORMAL LOW (ref 80.0–100.0)
Monocytes Absolute: 0.5 10*3/uL (ref 0.1–1.0)
Monocytes Absolute: 0.2 10*3/uL (ref 0.1–1.0)
Monocytes Relative: 7 %
Monocytes Relative: 3 %
Neutro Abs: 4.2 10*3/uL (ref 1.7–7.7)
Neutro Abs: 3 10*3/uL (ref 1.7–7.7)
Neutrophils Relative %: 65 %
Neutrophils Relative %: 58 %
Platelets: 633 10*3/uL — ABNORMAL HIGH (ref 150–400)
Platelets: 540 10*3/uL — ABNORMAL HIGH (ref 150–400)
RBC: 4.17 MIL/uL (ref 3.87–5.11)
RBC: 4.26 MIL/uL (ref 3.87–5.11)
RDW: 29.2 % — ABNORMAL HIGH (ref 11.5–15.5)
RDW: 29.3 % — ABNORMAL HIGH (ref 11.5–15.5)
WBC: 6.5 10*3/uL (ref 4.0–10.5)
WBC: 5.2 10*3/uL (ref 4.0–10.5)
nRBC: 0 % (ref 0.0–0.2)
nRBC: 0 % (ref 0.0–0.2)
nRBC: 0 /100{WBCs}

## 2023-07-05 LAB — RETICULOCYTES
Immature Retic Fract: 23.3 % — ABNORMAL HIGH (ref 2.3–15.9)
RBC.: 3.51 MIL/uL — ABNORMAL LOW (ref 3.87–5.11)
Retic Count, Absolute: 44.9 10*3/uL (ref 19.0–186.0)
Retic Ct Pct: 1.3 % (ref 0.4–3.1)

## 2023-07-05 LAB — URINALYSIS, COMPLETE (UACMP) WITH MICROSCOPIC
Bilirubin Urine: NEGATIVE
Glucose, UA: NEGATIVE mg/dL
Hgb urine dipstick: NEGATIVE
Ketones, ur: NEGATIVE mg/dL
Nitrite: NEGATIVE
Protein, ur: 30 mg/dL — AB
Specific Gravity, Urine: 1.028 (ref 1.005–1.030)
pH: 5 (ref 5.0–8.0)

## 2023-07-05 LAB — IRON AND TIBC
Iron: 8 ug/dL — ABNORMAL LOW (ref 28–170)
Saturation Ratios: 2 % — ABNORMAL LOW (ref 10.4–31.8)
TIBC: 494 ug/dL — ABNORMAL HIGH (ref 250–450)
UIBC: 486 ug/dL

## 2023-07-05 LAB — CK: Total CK: 32 U/L — ABNORMAL LOW (ref 38–234)

## 2023-07-05 LAB — HEPATIC FUNCTION PANEL
ALT: 12 U/L (ref 0–44)
AST: 14 U/L — ABNORMAL LOW (ref 15–41)
Albumin: 3.9 g/dL (ref 3.5–5.0)
Alkaline Phosphatase: 36 U/L — ABNORMAL LOW (ref 38–126)
Bilirubin, Direct: 0.2 mg/dL (ref 0.0–0.2)
Indirect Bilirubin: 1.3 mg/dL — ABNORMAL HIGH (ref 0.3–0.9)
Total Bilirubin: 1.5 mg/dL — ABNORMAL HIGH (ref 0.0–1.2)
Total Protein: 6.7 g/dL (ref 6.5–8.1)

## 2023-07-05 LAB — HIV ANTIBODY (ROUTINE TESTING W REFLEX): HIV Screen 4th Generation wRfx: NONREACTIVE

## 2023-07-05 LAB — MAGNESIUM: Magnesium: 1.8 mg/dL (ref 1.7–2.4)

## 2023-07-05 LAB — TSH: TSH: 0.766 u[IU]/mL (ref 0.350–4.500)

## 2023-07-05 LAB — TROPONIN I (HIGH SENSITIVITY): Troponin I (High Sensitivity): <2 ng/L (ref <18)

## 2023-07-05 LAB — FOLATE: Folate: 12.8 ng/mL (ref >5.9)

## 2023-07-05 LAB — VITAMIN B12: Vitamin B-12: 129 pg/mL — ABNORMAL LOW (ref 180–914)

## 2023-07-05 LAB — FERRITIN: Ferritin: 1 ng/mL — ABNORMAL LOW (ref 11–307)

## 2023-07-05 MED ORDER — DOXYCYCLINE HYCLATE 100 MG PO TABS
100.0000 mg | ORAL_TABLET | Freq: Two times a day (BID) | ORAL | Status: DC
  Administered 2023-07-05 – 2023-07-06 (×3): 100 mg via ORAL
  Filled 2023-07-05 (×3): qty 1

## 2023-07-05 MED ORDER — SODIUM CHLORIDE 0.9% FLUSH: 3.0000 mL | INTRAVENOUS | Status: DC | PRN

## 2023-07-05 MED ORDER — SODIUM CHLORIDE 0.9 % IV SOLN: 250.0000 mL | INTRAVENOUS | Status: AC | PRN

## 2023-07-05 MED ORDER — HYDROCODONE-ACETAMINOPHEN 5-325 MG PO TABS
1.0000 | ORAL_TABLET | ORAL | Status: DC | PRN
  Administered 2023-07-05 (×3): 2 via ORAL
  Administered 2023-07-05: 1 via ORAL
  Filled 2023-07-05: qty 2
  Filled 2023-07-05: qty 1
  Filled 2023-07-05 (×2): qty 2

## 2023-07-05 MED ORDER — ONDANSETRON HCL 4 MG/2ML IJ SOLN: 4.0000 mg | Freq: Four times a day (QID) | INTRAMUSCULAR | Status: DC | PRN

## 2023-07-05 MED ORDER — ACETAMINOPHEN 325 MG PO TABS: 650.0000 mg | ORAL_TABLET | Freq: Four times a day (QID) | ORAL | Status: DC | PRN

## 2023-07-05 MED ORDER — FERROUS SULFATE 325 (65 FE) MG PO TABS: 325.0000 mg | ORAL_TABLET | Freq: Every day | ORAL | Status: DC

## 2023-07-05 MED ORDER — SODIUM CHLORIDE 0.9% FLUSH
3.0000 mL | Freq: Two times a day (BID) | INTRAVENOUS | Status: DC
  Administered 2023-07-05 (×2): 3 mL via INTRAVENOUS

## 2023-07-05 MED ORDER — ONDANSETRON HCL 4 MG PO TABS: 4.0000 mg | ORAL_TABLET | Freq: Four times a day (QID) | ORAL | Status: DC | PRN

## 2023-07-05 MED ORDER — AMOXICILLIN-POT CLAVULANATE 875-125 MG PO TABS
1.0000 | ORAL_TABLET | Freq: Two times a day (BID) | ORAL | Status: DC
  Administered 2023-07-05 – 2023-07-06 (×2): 1 via ORAL
  Filled 2023-07-05 (×2): qty 1

## 2023-07-05 MED ORDER — ACETAMINOPHEN 650 MG RE SUPP: 650.0000 mg | Freq: Four times a day (QID) | RECTAL | Status: DC | PRN

## 2023-07-05 NOTE — ED Notes (Signed)
 Pt ambulated to the bathroom.

## 2023-07-05 NOTE — Progress Notes (Signed)
 PROGRESS NOTE    Maureen Hensley  ZOX:[id number] DOB: November 13, 1985 DOA: 07/04/2023 PCP: Patient, No Pcp Per  Outpatient Specialists:     Brief Narrative:  Patient is a 38 year old female past medical history significant for anemia, endometriosis and menorrhagia.  Patient was admitted with symptomatic anemia and musculoskeletal chest pain.  Troponin is negative.  Reproducible chest pain response to analgesics.  On presentation to the hospital, patient's hemoglobin was 5.7 g/dL.  Patient has been transfused with 2 units of packed red blood cells.  Last hemoglobin was 8 g/dL.  07/05/2023: Patient seen.  Continues to report intermittent, reproducible chest pain involving lower sternal region.  Patient endorses longstanding anemia and endometriosis.  Patient is not keen on being discharged back home today.  Assessment & Plan:   Principal Problem:   Symptomatic anemia Active Problems:   Menorrhagia   Chest pain   Thrombocytosis   Symptomatic anemia: -Patient has been transfused with 2 units of packed red blood cells. -Hemoglobin has improved from 5.7 g/dL to 8 g/dL -No shortness of breath. -Long history of endometriosis and menorrhagia. -Patient does not have a PCP or OB/GYN.  According to the patient, she moved down from [place] to [place] in September 2024.  Patient will need to follow-up with PCP and OB/GYN on discharge. -Patient is not keen on being discharged back home today.  Repeat CBC in the morning. -Likely discharge back on tomorrow.  Chest pain: -Negative troponin. -Reproducible. -Likely musculoskeletal chest pain. -Adequate analgesia.  Menorrhagia: Endometritis: -Patient will need to follow-up with OB/GYN for discharge.  Iron deficiency: -Likely secondary to blood loss. -Start patient on iron supplements.  Right middle finger infection/paronychia: -Continue antibiotics. -Continue to monitor closely, as there may be need for minimal incision and  drainage.   DVT prophylaxis: SCD. Code Status: Code. Family Communication:  Disposition Plan: Likely discharge back home tomorrow.   Consultants:  None.  Procedures:  None.  Antimicrobials:  Doxycycline 100 Mg p.o. twice daily. Add Augmentin.   Subjective: No new complaints.  Objective: Vitals:   07/05/23 1000 07/05/23 1100 07/05/23 1215 07/05/23 1216  BP: 102/64  106/70   Pulse: 63  71   Resp: 18  14   Temp:  (!) 97.2 F (36.2 C)  98.1 F (36.7 C)  TempSrc:  Oral  Oral  SpO2: 100%  98%   Weight:      Height:        Intake/Output Summary (Last 24 hours) at 07/05/2023 1446 Last data filed at 07/05/2023 0533 Gross per 24 hour  Intake 424 ml  Output --  Net 424 ml   Filed Weights   07/04/23 2110  Weight: 61.2 kg    Examination:  General exam: Appears calm and comfortable.  Patient is pale. Respiratory system: Clear to auscultation.  Cardiovascular system: S1 & S2 heard Gastrointestinal system: Abdomen is soft and nontender.   Central nervous system: Awake and alert.  Moves all extremities.   Extremities: Infection involving tip of right middle finger/paronychia.  Data Reviewed: I have personally reviewed following labs and imaging studies  CBC: Recent Labs  Lab 07/04/23 2112 07/05/23 0320 07/05/23 0742  WBC 7.0 6.5 5.2  NEUTROABS  --  4.2 3.0  HGB 5.7* 7.1* 8.0*  HCT 22.0* 26.5* 28.3*  MCV 59.9* 63.5* 66.4*  PLT 765* 633* 540*   Basic Metabolic Panel: Recent Labs  Lab 07/04/23 2112 07/05/23 0320  NA 137  --   K 3.5  --   CL  105  --   CO2 20*  --   GLUCOSE 112*  --   BUN 9  --   CREATININE 0.63  --   CALCIUM 8.9  --   MG  --  1.8  PHOS  --  4.6   GFR: Estimated Creatinine Clearance: 83.6 mL/min (by C-G formula based on SCr of 0.63 mg/dL). Liver Function Tests: Recent Labs  Lab 07/05/23 0320  AST 14*  ALT 12  ALKPHOS 36*  BILITOT 1.5*  PROT 6.7  ALBUMIN 3.9   No results for input(s): "LIPASE", "AMYLASE" in the last 168  hours. No results for input(s): "AMMONIA" in the last 168 hours. Coagulation Profile: No results for input(s): "INR", "PROTIME" in the last 168 hours. Cardiac Enzymes: Recent Labs  Lab 07/05/23 0320  CKTOTAL 32*   BNP (last 3 results) No results for input(s): "PROBNP" in the last 8760 hours. HbA1C: No results for input(s): "HGBA1C" in the last 72 hours. CBG: No results for input(s): "GLUCAP" in the last 168 hours. Lipid Profile: No results for input(s): "CHOL", "HDL", "LDLCALC", "TRIG", "CHOLHDL", "LDLDIRECT" in the last 72 hours. Thyroid Function Tests: Recent Labs    07/05/23 0320  TSH 0.766   Anemia Panel: Recent Labs    07/04/23 2307  VITAMINB12 129*  FOLATE 12.8  FERRITIN 1*  TIBC 494*  IRON 8*  RETICCTPCT 1.3   Urine analysis:    Component Value Date/Time   COLORURINE AMBER (A) 07/05/2023 0358   APPEARANCEUR CLOUDY (A) 07/05/2023 0358   LABSPEC 1.028 07/05/2023 0358   PHURINE 5.0 07/05/2023 0358   GLUCOSEU NEGATIVE 07/05/2023 0358   HGBUR NEGATIVE 07/05/2023 0358   BILIRUBINUR NEGATIVE 07/05/2023 0358   KETONESUR NEGATIVE 07/05/2023 0358   PROTEINUR 30 (A) 07/05/2023 0358   NITRITE NEGATIVE 07/05/2023 0358   LEUKOCYTESUR LARGE (A) 07/05/2023 0358   Sepsis Labs: @LABRCNTIP (procalcitonin:4,lacticidven:4)  )No results found for this or any previous visit (from the past 240 hours).       Radiology Studies: ECHOCARDIOGRAM COMPLETE Result Date: 07/05/2023    ECHOCARDIOGRAM REPORT   Patient Name:   Maureen Hensley Date of Exam: 07/05/2023 Medical Rec #:  [id number]      Height:       62.0 in Accession #:    [id number]     Weight:       135.0 lb Date of Birth:  12-30-1985      BSA:          1.618 m Patient Age:    37 years       BP:           106/72 mmHg Patient Gender: F              HR:           64 bpm. Exam Location:  Inpatient Procedure: 2D Echo, Cardiac Doppler and Color Doppler (Both Spectral and Color            Flow Doppler were utilized during  procedure). Indications:    Chest Pain R07.9  History:        Patient has no prior history of Echocardiogram examinations.                 Signs/Symptoms:Chest Pain.  Sonographer:    Lucendia Herrlich RCS Referring Phys: [id number] ANASTASSIA DOUTOVA IMPRESSIONS  1. Left ventricular ejection fraction, by estimation, is 60 to 65%. Left ventricular ejection fraction by PLAX is 64 %. The left ventricle has normal function. The left  ventricle has no regional wall motion abnormalities. Left ventricular diastolic parameters were normal.  2. Right ventricular systolic function is normal. The right ventricular size is normal. There is normal pulmonary artery systolic pressure. The estimated right ventricular systolic pressure is 28.2 mmHg.  3. Left atrial size was mildly dilated.  4. The mitral valve is grossly normal. Trivial mitral valve regurgitation.  5. The tricuspid valve is abnormal. Tricuspid valve regurgitation is mild to moderate.  6. The aortic valve is tricuspid. Aortic valve regurgitation is not visualized. No aortic stenosis is present.  7. The inferior vena cava is normal in size with <50% respiratory variability, suggesting right atrial pressure of 8 mmHg. Comparison(s): No prior Echocardiogram. FINDINGS  Left Ventricle: Left ventricular ejection fraction, by estimation, is 60 to 65%. Left ventricular ejection fraction by PLAX is 64 %. The left ventricle has normal function. The left ventricle has no regional wall motion abnormalities. Strain imaging was  not performed. The left ventricular internal cavity size was normal in size. There is no left ventricular hypertrophy. Left ventricular diastolic parameters were normal. Right Ventricle: The right ventricular size is normal. No increase in right ventricular wall thickness. Right ventricular systolic function is normal. There is normal pulmonary artery systolic pressure. The tricuspid regurgitant velocity is 2.25 m/s, and  with an assumed right atrial pressure of 8  mmHg, the estimated right ventricular systolic pressure is 28.2 mmHg. Left Atrium: Left atrial size was mildly dilated. Right Atrium: Right atrial size was normal in size. Pericardium: There is no evidence of pericardial effusion. Mitral Valve: The mitral valve is grossly normal. Trivial mitral valve regurgitation. Tricuspid Valve: The tricuspid valve is abnormal. Tricuspid valve regurgitation is mild to moderate. Aortic Valve: The aortic valve is tricuspid. Aortic valve regurgitation is not visualized. No aortic stenosis is present. Aortic valve peak gradient measures 5.1 mmHg. Pulmonic Valve: The pulmonic valve was grossly normal. Pulmonic valve regurgitation is trivial. Aorta: The aortic root and ascending aorta are structurally normal, with no evidence of dilitation. Venous: The inferior vena cava is normal in size with less than 50% respiratory variability, suggesting right atrial pressure of 8 mmHg. IAS/Shunts: No atrial level shunt detected by color flow Doppler. Additional Comments: 3D imaging was not performed.  LEFT VENTRICLE PLAX 2D LV EF:         Left            Diastology                ventricular     LV e' medial:    14.80 cm/s                ejection        LV E/e' medial:  3.6                fraction by     LV e' lateral:   15.10 cm/s                PLAX is 64      LV E/e' lateral: 3.6                %. LVIDd:         4.60 cm LVIDs:         3.00 cm LV PW:         0.90 cm LV IVS:        0.80 cm LVOT diam:     2.30 cm LV SV:  86 LV SV Index:   53 LVOT Area:     4.15 cm  RIGHT VENTRICLE             IVC RV S prime:     12.40 cm/s  IVC diam: 2.00 cm TAPSE (M-mode): 1.7 cm LEFT ATRIUM             Index        RIGHT ATRIUM           Index LA diam:        3.20 cm 1.98 cm/m   RA Area:     20.55 cm LA Vol (A2C):   55.1 ml 34.06 ml/m  RA Volume:   59.55 ml  36.81 ml/m LA Vol (A4C):   60.3 ml 37.28 ml/m LA Biplane Vol: 62.4 ml 38.58 ml/m  AORTIC VALVE AV Area (Vmax): 3.75 cm AV Vmax:         113.00 cm/s AV Peak Grad:   5.1 mmHg LVOT Vmax:      102.00 cm/s LVOT Vmean:     65.200 cm/s LVOT VTI:       0.208 m  AORTA Ao Root diam: 3.30 cm Ao Asc diam:  3.40 cm MITRAL VALVE               TRICUSPID VALVE MV Area (PHT): 3.77 cm    TR Peak grad:   20.2 mmHg MV Decel Time: 201 msec    TR Vmax:        225.00 cm/s MV E velocity: 54.00 cm/s MV A velocity: 40.80 cm/s  SHUNTS MV E/A ratio:  1.32        Systemic VTI:  0.21 m                            Systemic Diam: 2.30 cm Zoila Shutter MD Electronically signed by Zoila Shutter MD Signature Date/Time: 07/05/2023/10:37:26 AM    Final    DG Chest 2 View Result Date: 07/04/2023 CLINICAL DATA:  CP EXAM: CHEST - 2 VIEW COMPARISON:  None Available. FINDINGS: The heart and mediastinal contours are within normal limits. No focal consolidation. No pulmonary edema. No pleural effusion. No pneumothorax. No acute osseous abnormality. IMPRESSION: No active cardiopulmonary disease. Electronically Signed   By: Tish Frederickson M.D.   On: 07/04/2023 21:37        Scheduled Meds:  doxycycline  100 mg Oral BID   sodium chloride flush  3 mL Intravenous Q12H   Continuous Infusions:  sodium chloride       LOS: 0 days    Time spent: 35 minutes.    Berton Mount, MD  [facility] Pager #: [phone] 7PM-7AM contact night coverage as above

## 2023-07-05 NOTE — Plan of Care (Signed)
  Problem: Education: Goal: Knowledge of General Education information will improve Description: Including pain rating scale, medication(s)/side effects and non-pharmacologic comfort measures Outcome: Progressing   Problem: Health Behavior/Discharge Planning: Goal: Ability to manage health-related needs will improve Outcome: Progressing   Problem: Clinical Measurements: Goal: Ability to maintain clinical measurements within normal limits will improve Outcome: Progressing Goal: Will remain free from infection Outcome: Progressing Goal: Diagnostic test results will improve Outcome: Progressing Goal: Respiratory complications will improve Outcome: Progressing Goal: Cardiovascular complication will be avoided Outcome: Progressing   Problem: Activity: Goal: Risk for activity intolerance will decrease Outcome: Progressing   Problem: Nutrition: Goal: Adequate nutrition will be maintained Outcome: Progressing   Problem: Coping: Goal: Level of anxiety will decrease Outcome: Progressing   Problem: Pain Managment: Goal: General experience of comfort will improve and/or be controlled Outcome: Progressing   Problem: Elimination: Goal: Will not experience complications related to bowel motility Outcome: Progressing Goal: Will not experience complications related to urinary retention Outcome: Progressing   Problem: Safety: Goal: Ability to remain free from injury will improve Outcome: Progressing

## 2023-07-05 NOTE — Progress Notes (Signed)
 Echocardiogram 2D Echocardiogram has been performed.  Lucendia Herrlich 07/05/2023, 8:49 AM

## 2023-07-05 NOTE — ED Notes (Signed)
 Pt unable to urinate at this time.

## 2023-07-06 ENCOUNTER — Other Ambulatory Visit (HOSPITAL_COMMUNITY): Payer: Self-pay

## 2023-07-06 DIAGNOSIS — D649 Anemia, unspecified: Secondary | ICD-10-CM | POA: Diagnosis not present

## 2023-07-06 LAB — BPAM RBC
Blood Product Expiration Date: 202503292359
Blood Product Expiration Date: 202503292359
ISSUE DATE / TIME: 202503012340
ISSUE DATE / TIME: 202503020332
Unit Type and Rh: 202503292359
Unit Type and Rh: 7300
Unit Type and Rh: 7300

## 2023-07-06 LAB — CBC WITH DIFFERENTIAL/PLATELET
Abs Immature Granulocytes: 0.02 10*3/uL (ref 0.00–0.07)
Basophils Absolute: 0.1 10*3/uL (ref 0.0–0.1)
Basophils Relative: 1 %
Eosinophils Absolute: 0.1 10*3/uL (ref 0.0–0.5)
Eosinophils Relative: 2 %
HCT: 28.2 % — ABNORMAL LOW (ref 36.0–46.0)
Hemoglobin: 8.3 g/dL — ABNORMAL LOW (ref 12.0–15.0)
Immature Granulocytes: 0 %
Lymphocytes Relative: 28 %
Lymphs Abs: 1.7 10*3/uL (ref 0.7–4.0)
MCH: 19.1 pg — ABNORMAL LOW (ref 26.0–34.0)
MCHC: 29.4 g/dL — ABNORMAL LOW (ref 30.0–36.0)
MCV: 64.8 fL — ABNORMAL LOW (ref 80.0–100.0)
Monocytes Absolute: 0.5 10*3/uL (ref 0.1–1.0)
Monocytes Relative: 8 %
Neutro Abs: 3.7 10*3/uL (ref 1.7–7.7)
Neutrophils Relative %: 61 %
Platelets: 536 10*3/uL — ABNORMAL HIGH (ref 150–400)
RBC: 4.35 MIL/uL (ref 3.87–5.11)
RDW: 29.2 % — ABNORMAL HIGH (ref 11.5–15.5)
Smear Review: INCREASED
WBC: 6.1 10*3/uL (ref 4.0–10.5)
nRBC: 0 % (ref 0.0–0.2)

## 2023-07-06 LAB — TYPE AND SCREEN
ABO/RH(D): B POS
Antibody Screen: NEGATIVE
Unit division: 0
Unit division: 0

## 2023-07-06 MED ORDER — LIDOCAINE HCL 1 % IJ SOLN
5.0000 mL | Freq: Once | INTRAMUSCULAR | Status: DC
  Filled 2023-07-06: qty 5

## 2023-07-06 MED ORDER — SULFAMETHOXAZOLE-TRIMETHOPRIM 800-160 MG PO TABS
1.0000 | ORAL_TABLET | Freq: Two times a day (BID) | ORAL | 0 refills | Status: DC
  Filled 2023-07-06: qty 14, 7d supply, fill #0

## 2023-07-06 MED ORDER — FOLIC ACID 1 MG PO TABS
1.0000 mg | ORAL_TABLET | Freq: Every day | ORAL | 0 refills | Status: DC
  Filled 2023-07-06: qty 30, 30d supply, fill #0

## 2023-07-06 MED ORDER — FOLIC ACID 1 MG PO TABS
1.0000 mg | ORAL_TABLET | Freq: Every day | ORAL | Status: DC
  Administered 2023-07-06: 1 mg via ORAL
  Filled 2023-07-06: qty 1

## 2023-07-06 MED ORDER — FERROUS SULFATE 325 (65 FE) MG PO TABS
325.0000 mg | ORAL_TABLET | Freq: Two times a day (BID) | ORAL | 0 refills | Status: DC
  Filled 2023-07-06: qty 60, 30d supply, fill #0

## 2023-07-06 MED ORDER — TETANUS-DIPHTHERIA TOXOIDS TD 5-2 LFU IM INJ
0.5000 mL | INJECTION | Freq: Once | INTRAMUSCULAR | Status: DC
  Filled 2023-07-06: qty 0.5

## 2023-07-06 MED ORDER — FERROUS SULFATE 325 (65 FE) MG PO TABS
325.0000 mg | ORAL_TABLET | Freq: Two times a day (BID) | ORAL | Status: DC
  Administered 2023-07-06: 325 mg via ORAL
  Filled 2023-07-06: qty 1

## 2023-07-06 NOTE — TOC Transition Note (Signed)
 Transition of Care [facility]) - Discharge Note   Patient Details  Name: Maureen Hensley MRN: [id number] Date of Birth: 1986/01/21  Transition of Care [facility] CM/SW Contact:  Kermit Balo, RN Phone Number: 07/06/2023, 11:28 AM   Clinical Narrative:     Pt is discharging home with self care. Information on PCP added to AVS.  Pt has transportation home.  Final next level of care: Home/Self Care Barriers to Discharge: No Barriers Identified   Patient Goals and CMS Choice            Discharge Placement                       Discharge Plan and Services Additional resources added to the After Visit Summary for                                       Social Drivers of Health (SDOH) Interventions SDOH Screenings   Food Insecurity: No Food Insecurity (07/05/2023)  Housing: Low Risk  (07/05/2023)  Transportation Needs: No Transportation Needs (07/05/2023)  Utilities: Not At Risk (07/05/2023)  Social Connections: Moderately Isolated (07/05/2023)  Tobacco Use: Medium Risk (07/04/2023)     Readmission Risk Interventions     No data to display

## 2023-07-06 NOTE — Plan of Care (Signed)

## 2023-07-06 NOTE — Progress Notes (Signed)
 Discharge instructions given with stated understanding.  Patient given dressing supplies for finger where an I and D was performed.  Patient to pick up meds at [facility] pharmacy and go to discharge lounge.

## 2023-07-06 NOTE — Discharge Instructions (Addendum)
 Right index finger wound care as instructed by orthopedic surgery, keep it clean and dry at all times, follow-up with the surgeons office within a week.  Call and set up an appointment today.    Follow with Primary MD  in 7 days   Get CBC, CMP, Anemia panel -  checked next visit with your primary MD    Activity: As tolerated with Full fall precautions use walker/cane & assistance as needed  Disposition Home    Diet: Heart Healthy   Special Instructions: If you have smoked or chewed Tobacco  in the last 2 yrs please stop smoking, stop any regular Alcohol  and or any Recreational drug use.  On your next visit with your primary care physician please Get Medicines reviewed and adjusted.  Please request your Prim.MD to go over all Hospital Tests and Procedure/Radiological results at the follow up, please get all Hospital records sent to your Prim MD by signing hospital release before you go home.  If you experience worsening of your admission symptoms, develop shortness of breath, life threatening emergency, suicidal or homicidal thoughts you must seek medical attention immediately by calling 911 or calling your MD immediately  if symptoms less severe.  You Must read complete instructions/literature along with all the possible adverse reactions/side effects for all the Medicines you take and that have been prescribed to you. Take any new Medicines after you have completely understood and accpet all the possible adverse reactions/side effects.   Do not drive when taking Pain medications.  Do not take more than prescribed Pain, Sleep and Anxiety Medications

## 2023-07-06 NOTE — Discharge Summary (Signed)
 Maureen Hensley ZOX:[id number] DOB: 1985-05-08 DOA: 07/04/2023  PCP: Patient, No Pcp Per  Admit date: 07/04/2023  Discharge date: 07/06/2023  Admitted From: Home   Disposition:  Home   Recommendations for Outpatient Follow-up:   Follow up with PCP in 1-2 weeks  PCP Please obtain BMP/CBC, 2 view CXR in 1week,  (see Discharge instructions)   PCP Please follow up on the following pending results:    Home Health: None   Equipment/Devices: None  Consultations: Orthopedics Discharge Condition: Stable    CODE STATUS: Full    Diet Recommendation: Heart Healthy     Chief Complaint  Patient presents with   Chest Pain     Brief history of present illness from the day of admission and additional interim summary    38 year old female past medical history significant for anemia, endometriosis and menorrhagia.  Patient was admitted with symptomatic anemia and musculoskeletal chest pain.  Troponin is negative.  Reproducible chest pain response to analgesics.  On presentation to the hospital, patient's hemoglobin was 5.7 g/dL.  Patient has been transfused with 2 units of packed red blood cells.  Also had a right index finger infection and abscess for the last 5 days.                                                                 Hospital Course   Symptomatic anemia due to menorrhagia and endometritis: -Patient has been transfused with 2 units of packed red blood cells.  Now completely symptom-free eager to go home, Long history of endometriosis and menorrhagia.  Discussed the case with [facility] physician on-call Dr. Shawnie Pons she will see the patient within a week in the hospital.  Patient given oral iron and folic acid supplement, currently no ongoing bleeding.   Chest pain: -Negative troponin. -Reproducible. -Skeletal in nature,  reproducible and now completely resolved, echo stable.   Iron deficiency: -See #1 above placed on iron and folic acid supplementation   Right middle finger abscess.  Present on admission.  Orthopedics consulted drained at bedside, 7 days of Bactrim, outpatient follow-up with orthopedics postdischarge within a week.      Discharge diagnosis     Principal Problem:   Symptomatic anemia Active Problems:   Menorrhagia   Chest pain   Thrombocytosis    Discharge instructions    Discharge Instructions     Diet - low sodium heart healthy   Complete by: As directed    Discharge instructions   Complete by: As directed    Right index finger wound care as instructed by orthopedic surgery, keep it clean and dry at all times, follow-up with the surgeons office within a week.  Call and set up an appointment today.    Follow with Primary MD  in 7 days   Get  CBC, CMP, Anemia panel -  checked next visit with your primary MD    Activity: As tolerated with Full fall precautions use walker/cane & assistance as needed  Disposition Home    Diet: Heart Healthy   Special Instructions: If you have smoked or chewed Tobacco  in the last 2 yrs please stop smoking, stop any regular Alcohol  and or any Recreational drug use.  On your next visit with your primary care physician please Get Medicines reviewed and adjusted.  Please request your Prim.MD to go over all Hospital Tests and Procedure/Radiological results at the follow up, please get all Hospital records sent to your Prim MD by signing hospital release before you go home.  If you experience worsening of your admission symptoms, develop shortness of breath, life threatening emergency, suicidal or homicidal thoughts you must seek medical attention immediately by calling 911 or calling your MD immediately  if symptoms less severe.  You Must read complete instructions/literature along with all the possible adverse reactions/side effects for all  the Medicines you take and that have been prescribed to you. Take any new Medicines after you have completely understood and accpet all the possible adverse reactions/side effects.   Do not drive when taking Pain medications.  Do not take more than prescribed Pain, Sleep and Anxiety Medications   Increase activity slowly   Complete by: As directed        Discharge Medications   Allergies as of 07/06/2023   No Known Allergies      Medication List     STOP taking these medications    doxycycline 100 MG capsule Commonly known as: VIBRAMYCIN       TAKE these medications    ferrous sulfate 325 (65 FE) MG tablet Take 1 tablet (325 mg total) by mouth 2 (two) times daily with a meal.   folic acid 1 MG tablet Commonly known as: FOLVITE Take 1 tablet (1 mg total) by mouth daily. Start taking on: July 07, 2023   naproxen 500 MG tablet Commonly known as: NAPROSYN Take 1 tablet (500 mg total) by mouth every 12 (twelve) hours as needed for moderate pain (pain score 4-6) or mild pain (pain score 1-3).   sulfamethoxazole-trimethoprim 800-160 MG tablet Commonly known as: Bactrim DS Take 1 tablet by mouth 2 (two) times daily.         Follow-up Information     [facility]. Schedule an appointment as soon as possible for a visit in 1 week(s).   Contact information: [address] [place] [address] [phone]        Reva Bores, MD. Schedule an appointment as soon as possible for a visit in 1 week(s).   Specialty: Obstetrics and Gynecology Why: they will call you with an appointment Contact information: [address] [place] [address] [phone]         Gomez Cleverly, MD. Schedule an appointment as soon as possible for a visit in 1 week(s).   Specialty: Orthopedic Surgery Contact information: [address] [place] [address] [phone]                  Major procedures and Radiology Reports - PLEASE review detailed and final reports thoroughly  -      ECHOCARDIOGRAM COMPLETE Result Date: 07/05/2023    ECHOCARDIOGRAM REPORT   Patient Name:   Maureen Hensley Date of Exam: 07/05/2023 Medical Rec #:  [id number]      Height:       62.0 in Accession #:    [id number]     Weight:       135.0 lb Date of Birth:  [birth year]      BSA:          1.618 m Patient Age:    37 years       BP:           106/72 mmHg Patient Gender: F              HR:           64 bpm. Exam Location:  Inpatient Procedure: 2D Echo, Cardiac Doppler and Color Doppler (Both Spectral and Color            Flow Doppler were utilized during procedure). Indications:    Chest Pain R07.9  History:        Patient has no prior history of Echocardiogram examinations.                 Signs/Symptoms:Chest Pain.  Sonographer:    Lucendia Herrlich RCS Referring Phys: [id number] ANASTASSIA DOUTOVA IMPRESSIONS  1. Left ventricular ejection fraction, by estimation, is 60 to 65%. Left ventricular ejection fraction by PLAX is 64 %. The left ventricle has normal function. The left ventricle has no regional wall motion abnormalities. Left ventricular diastolic parameters were normal.  2. Right ventricular systolic function is normal. The right ventricular size is normal. There is normal pulmonary artery systolic pressure. The estimated right ventricular systolic pressure is 28.2 mmHg.  3. Left atrial size was mildly dilated.  4. The mitral valve is grossly normal. Trivial mitral valve regurgitation.  5. The tricuspid valve is abnormal. Tricuspid valve regurgitation is mild to moderate.  6. The aortic valve is tricuspid. Aortic valve regurgitation is not visualized. No aortic stenosis is present.  7. The inferior vena cava is normal in size with <50% respiratory variability, suggesting right atrial pressure of 8 mmHg. Comparison(s): No prior Echocardiogram. FINDINGS  Left Ventricle: Left ventricular ejection fraction, by  estimation, is 60 to 65%. Left ventricular ejection fraction by PLAX is 64 %. The left ventricle has normal function. The left ventricle has no regional wall motion abnormalities. Strain imaging was  not performed. The left ventricular internal cavity size was normal in size. There is no left ventricular hypertrophy. Left ventricular diastolic parameters were normal. Right Ventricle: The right ventricular size is normal. No increase in right ventricular wall thickness. Right ventricular systolic function is normal. There is normal pulmonary artery systolic pressure. The tricuspid regurgitant velocity is 2.25 m/s, and  with an assumed right atrial pressure of 8 mmHg, the estimated right ventricular systolic pressure is 28.2 mmHg. Left Atrium: Left atrial size was mildly dilated. Right Atrium: Right atrial size was normal in size. Pericardium: There is no evidence of pericardial effusion. Mitral Valve: The mitral valve is grossly normal. Trivial mitral valve regurgitation. Tricuspid Valve: The tricuspid valve is abnormal. Tricuspid valve regurgitation is mild to moderate. Aortic Valve: The aortic valve is tricuspid. Aortic valve regurgitation is not visualized. No aortic stenosis is present. Aortic valve peak gradient measures 5.1 mmHg. Pulmonic Valve: The pulmonic valve was grossly normal. Pulmonic valve regurgitation is trivial. Aorta: The aortic root and ascending aorta are structurally normal, with no evidence of dilitation. Venous: The inferior vena cava is normal in size with less than 50% respiratory variability, suggesting right atrial pressure of 8 mmHg. IAS/Shunts: No atrial  level shunt detected by color flow Doppler. Additional Comments: 3D imaging was not performed.  LEFT VENTRICLE PLAX 2D LV EF:         Left            Diastology                ventricular     LV e' medial:    14.80 cm/s                ejection        LV E/e' medial:  3.6                fraction by     LV e' lateral:   15.10 cm/s                 PLAX is 64      LV E/e' lateral: 3.6                %. LVIDd:         4.60 cm LVIDs:         3.00 cm LV PW:         0.90 cm LV IVS:        0.80 cm LVOT diam:     2.30 cm LV SV:         86 LV SV Index:   53 LVOT Area:     4.15 cm  RIGHT VENTRICLE             IVC RV S prime:     12.40 cm/s  IVC diam: 2.00 cm TAPSE (M-mode): 1.7 cm LEFT ATRIUM             Index        RIGHT ATRIUM           Index LA diam:        3.20 cm 1.98 cm/m   RA Area:     20.55 cm LA Vol (A2C):   55.1 ml 34.06 ml/m  RA Volume:   59.55 ml  36.81 ml/m LA Vol (A4C):   60.3 ml 37.28 ml/m LA Biplane Vol: 62.4 ml 38.58 ml/m  AORTIC VALVE AV Area (Vmax): 3.75 cm AV Vmax:        113.00 cm/s AV Peak Grad:   5.1 mmHg LVOT Vmax:      102.00 cm/s LVOT Vmean:     65.200 cm/s LVOT VTI:       0.208 m  AORTA Ao Root diam: 3.30 cm Ao Asc diam:  3.40 cm MITRAL VALVE               TRICUSPID VALVE MV Area (PHT): 3.77 cm    TR Peak grad:   20.2 mmHg MV Decel Time: 201 msec    TR Vmax:        225.00 cm/s MV E velocity: 54.00 cm/s MV A velocity: 40.80 cm/s  SHUNTS MV E/A ratio:  1.32        Systemic VTI:  0.21 m                            Systemic Diam: 2.30 cm Maureen Shutter MD Electronically signed by Maureen Shutter MD Signature Date/Time: 07/05/2023/10:37:26 AM    Final    DG Chest 2 View Result Date: 07/04/2023 CLINICAL DATA:  CP EXAM: CHEST - 2 VIEW COMPARISON:  None Available. FINDINGS: The heart and mediastinal contours are  within normal limits. No focal consolidation. No pulmonary edema. No pleural effusion. No pneumothorax. No acute osseous abnormality. IMPRESSION: No active cardiopulmonary disease. Electronically Signed   By: Tish Frederickson M.D.   On: 07/04/2023 21:37    Micro Results     No results found for this or any previous visit (from the past 240 hours).  Today   Subjective    Maureen Hensley today has no headache,no chest abdominal pain,no new weakness tingling or numbness, feels much better wants to go home today.      Objective   Blood pressure 120/78, pulse 66, temperature 97.7 F (36.5 C), temperature source Oral, resp. rate 18, height 5\' 2"  (1.575 m), weight 61.2 kg, last menstrual period 06/27/2023, SpO2 98%.  No intake or output data in the 24 hours ending 07/06/23 1101  Exam  Awake Alert, No new F.N deficits,    [place].AT,PERRAL Supple Neck,   Symmetrical Chest wall movement, Good air movement bilaterally, CTAB RRR,No Gallops,   +ve B.Sounds, Abd Soft, Non tender,  Right middle finger abscess noted   Data Review   Recent Labs  Lab 07/04/23 2112 07/05/23 0320 07/05/23 0742 07/06/23 0546  WBC 7.0 6.5 5.2 6.1  HGB 5.7* 7.1* 8.0* 8.3*  HCT 22.0* 26.5* 28.3* 28.2*  PLT 765* 633* 540* 536*  MCV 59.9* 63.5* 66.4* 64.8*  MCH 15.5* 17.0* 18.8* 19.1*  MCHC 25.9* 26.8* 28.3* 29.4*  RDW 27.5* 29.2* 29.3* 29.2*  LYMPHSABS  --  1.6 1.8 1.7  MONOABS  --  0.5 0.2 0.5  EOSABS  --  0.1 0.2 0.1  BASOSABS  --  0.1 0.1 0.1    Recent Labs  Lab 07/04/23 2112 07/05/23 0320  NA 137  --   K 3.5  --   CL 105  --   CO2 20*  --   ANIONGAP 12  --   GLUCOSE 112*  --   BUN 9  --   CREATININE 0.63  --   AST  --  14*  ALT  --  12  ALKPHOS  --  36*  BILITOT  --  1.5*  ALBUMIN  --  3.9  TSH  --  0.766  MG  --  1.8  PHOS  --  4.6  CALCIUM 8.9  --     Total Time in preparing paper work, data evaluation and todays exam - 35 minutes  Signature  -    Susa Raring M.D on 07/06/2023 at 11:01 AM   -  To page go to [email or web address]

## 2023-07-06 NOTE — Procedures (Signed)
 Procedure: Right long finger I&D paronychia   Indication: Right long finger paronychia   Surgeon: Charma Igo, PA-C   Assist: None   Anesthesia: 4ml 1% plain lidocaine as digital block   EBL: None   Complications: None   Findings: After risks/benefits explained patient desires to undergo procedure. Consent obtained and time out performed. The right long finger was digitally blocked. The finger was sterilely prepped and a #11 blade was used to make a stab incision next to the ulnar aspect of the nail. All evident purulent material was evacuated and the finger dressed. Pt tolerated the procedure well.       Freeman Caldron, PA-C Orthopedic Surgery [phone]

## 2023-07-06 NOTE — Consult Note (Signed)
 Reason for Consult:Right long finger paronychia Referring Physician: Susa Raring Time called: 0820 Time at bedside: 1029   Maureen Hensley is an 38 y.o. female.  HPI: Tiajuana developed a paronychia on her right long finger last Wednesday. She was seen at [facility] and placed on abx but did not get any better. She presented to the ED last night and was admitted with CP. Paronychia was noted today and hand surgery was consulted. She denies prior e/o or any nail trauma or recent manicure.She is RHD and works in Personnel officer.  Past Medical History:  Diagnosis Date   Anemia     Past Surgical History:  Procedure Laterality Date   CESAREAN SECTION     3   ECTOPIC PREGNANCY SURGERY      History reviewed. No pertinent family history.  Social History:  reports that she has quit smoking. Her smoking use included cigarettes. She has never used smokeless tobacco. She reports that she does not currently use alcohol. She reports that she does not currently use drugs.  Allergies: No Known Allergies  Medications: I have reviewed the patient's current medications.  Results for orders placed or performed during the hospital encounter of 07/04/23 (from the past 48 hours)  Basic metabolic panel     Status: Abnormal   Collection Time: 07/04/23  9:12 PM  Result Value Ref Range   Sodium 137 135 - 145 mmol/L   Potassium 3.5 3.5 - 5.1 mmol/L   Chloride 105 98 - 111 mmol/L   CO2 20 (L) 22 - 32 mmol/L   Glucose, Bld 112 (H) 70 - 99 mg/dL    Comment: Glucose reference range applies only to samples taken after fasting for at least 8 hours.   BUN 9 6 - 20 mg/dL   Creatinine, Ser 6.96 0.44 - 1.00 mg/dL   Calcium 8.9 8.9 - 29.5 mg/dL   GFR, Estimated >28 >41 mL/min    Comment: (NOTE) Calculated using the CKD-EPI Creatinine Equation (2021)    Anion gap 12 5 - 15    Comment: Performed at [facility], [address] [place], [place] [address]  CBC     Status: Abnormal   Collection Time: 07/04/23  9:12  PM  Result Value Ref Range   WBC 7.0 4.0 - 10.5 K/uL   RBC 3.67 (L) 3.87 - 5.11 MIL/uL   Hemoglobin 5.7 (LL) 12.0 - 15.0 g/dL    Comment: REPEATED TO VERIFY Reticulocyte Hemoglobin testing may be clinically indicated, consider ordering this additional test [id number] THIS CRITICAL RESULT HAS VERIFIED AND BEEN CALLED TO B. OSORIO, RN BY JEZREEL LAS IGAN ON 03 01 2025 AT 2149, AND HAS BEEN READ BACK.     HCT 22.0 (L) 36.0 - 46.0 %   MCV 59.9 (L) 80.0 - 100.0 fL   MCH 15.5 (L) 26.0 - 34.0 pg   MCHC 25.9 (L) 30.0 - 36.0 g/dL   RDW 64.4 (H) 03.4 - 74.2 %   Platelets 765 (H) 150 - 400 K/uL    Comment: REPEATED TO VERIFY   nRBC 0.0 0.0 - 0.2 %    Comment: Performed at [facility], [address] [place], [place] [address]  Troponin I (High Sensitivity)     Status: None   Collection Time: 07/04/23  9:12 PM  Result Value Ref Range   Troponin I (High Sensitivity) <2 <18 ng/L    Comment: (NOTE) Elevated high sensitivity troponin I (hsTnI) values and significant  changes across serial  measurements may suggest ACS but many other  chronic and acute conditions are known to elevate hsTnI results.  Refer to the "Links" section for chest pain algorithms and additional  guidance. Performed at [facility], [address], [place], [place] [address]   hCG, serum, qualitative     Status: None   Collection Time: 07/04/23  9:12 PM  Result Value Ref Range   Preg, Serum NEGATIVE NEGATIVE    Comment:        THE SENSITIVITY OF THIS METHODOLOGY IS >10 mIU/mL. Performed at [facility], [address], [place], [place] [address]   Type and screen     Status: None   Collection Time: 07/04/23 10:26 PM  Result Value Ref Range   ABO/RH(D) B POS    Antibody Screen NEG    Sample Expiration 07/07/2023,2359    Unit Number [id number]    Blood Component Type RED CELLS,LR    Unit division 00    Status of Unit ISSUED,FINAL    Transfusion Status OK TO TRANSFUSE    Crossmatch Result  Compatible    Unit Number [id number]    Blood Component Type RED CELLS,LR    Unit division 00    Status of Unit ISSUED,FINAL    Transfusion Status OK TO TRANSFUSE    Crossmatch Result      Compatible Performed at [facility], [address], [place], [place] [address]   Prepare RBC (crossmatch)     Status: None   Collection Time: 07/04/23 10:26 PM  Result Value Ref Range   Order Confirmation      ORDER PROCESSED BY BLOOD BANK Performed at [facility], [address], [place], [place] [address]   Troponin I (High Sensitivity)     Status: None   Collection Time: 07/04/23 11:07 PM  Result Value Ref Range   Troponin I (High Sensitivity) <2 <18 ng/L    Comment: (NOTE) Elevated high sensitivity troponin I (hsTnI) values and significant  changes across serial measurements may suggest ACS but many other  chronic and acute conditions are known to elevate hsTnI results.  Refer to the "Links" section for chest pain algorithms and additional  guidance. Performed at [facility], [address], [place], [place] [address]   Vitamin B12     Status: Abnormal   Collection Time: 07/04/23 11:07 PM  Result Value Ref Range   Vitamin B-12 129 (L) 180 - 914 pg/mL    Comment: (NOTE) This assay is not validated for testing neonatal or myeloproliferative syndrome specimens for Vitamin B12 levels. Performed at [facility], [address], [place], [place] [address]   Folate     Status: None   Collection Time: 07/04/23 11:07 PM  Result Value Ref Range   Folate 12.8 >5.9 ng/mL    Comment: Performed at [facility], [address] [place], [place] [address]  Iron and TIBC     Status: Abnormal   Collection Time: 07/04/23 11:07 PM  Result Value Ref Range   Iron 8 (L) 28 - 170 ug/dL   TIBC 347 (H) 425 - 956 ug/dL   Saturation Ratios 2 (L) 10.4 - 31.8 %   UIBC 486 ug/dL    Comment: Performed at [facility], [address] [place], [place] [address]   Ferritin     Status: Abnormal   Collection Time: 07/04/23 11:07 PM  Result Value Ref Range   Ferritin  1 (L) 11 - 307 ng/mL    Comment: Performed at [facility], [address] [place], [place] [address]  Reticulocytes     Status: Abnormal   Collection Time: 07/04/23 11:07 PM  Result Value Ref Range   Retic Ct Pct 1.3 0.4 - 3.1 %   RBC. 3.51 (L) 3.87 - 5.11 MIL/uL   Retic Count, Absolute 44.9 19.0 - 186.0 K/uL   Immature Retic Fract 23.3 (H) 2.3 - 15.9 %    Comment: Performed at [facility], [address] [place], [place] [address]  CK     Status: Abnormal   Collection Time: 07/05/23  3:20 AM  Result Value Ref Range   Total CK 32 (L) 38 - 234 U/L    Comment: Performed at [facility], [address] [place], [place] [address]  Magnesium     Status: None   Collection Time: 07/05/23  3:20 AM  Result Value Ref Range   Magnesium 1.8 1.7 - 2.4 mg/dL    Comment: Performed at [facility], [address], [place], [place] [address]  TSH     Status: None   Collection Time: 07/05/23  3:20 AM  Result Value Ref Range   TSH 0.766 0.350 - 4.500 uIU/mL    Comment: Performed by a 3rd Generation assay with a functional sensitivity of <=0.01 uIU/mL. Performed at [facility], [address], [place], [place] [address]   Hepatic function panel     Status: Abnormal   Collection Time: 07/05/23  3:20 AM  Result Value Ref Range   Total Protein 6.7 6.5 - 8.1 g/dL   Albumin 3.9 3.5 - 5.0 g/dL   AST 14 (L) 15 - 41 U/L   ALT 12 0 - 44 U/L   Alkaline Phosphatase 36 (L) 38 - 126 U/L   Total Bilirubin 1.5 (H) 0.0 - 1.2 mg/dL   Bilirubin, Direct 0.2 0.0 - 0.2 mg/dL   Indirect Bilirubin 1.3 (H) 0.3 - 0.9 mg/dL    Comment: Performed at [facility], [address] [place], [place] [address]  CBC with Differential/Platelet     Status: Abnormal   Collection Time: 07/05/23  3:20 AM  Result Value Ref Range   WBC 6.5 4.0 - 10.5 K/uL   RBC 4.17 3.87 - 5.11 MIL/uL    Hemoglobin 7.1 (L) 12.0 - 15.0 g/dL    Comment: Reticulocyte Hemoglobin testing may be clinically indicated, consider ordering this additional test [id number]    HCT 26.5 (L) 36.0 - 46.0 %   MCV 63.5 (L) 80.0 - 100.0 fL   MCH 17.0 (L) 26.0 - 34.0 pg   MCHC 26.8 (L) 30.0 - 36.0 g/dL   RDW 01.0 (H) 27.2 - 53.6 %   Platelets 633 (H) 150 - 400 K/uL    Comment: REPEATED TO VERIFY   nRBC 0.0 0.0 - 0.2 %   Neutrophils Relative % 65 %   Neutro Abs 4.2 1.7 - 7.7 K/uL   Lymphocytes Relative 25 %   Lymphs Abs 1.6 0.7 - 4.0 K/uL   Monocytes Relative 7 %   Monocytes Absolute 0.5 0.1 - 1.0 K/uL   Eosinophils Relative 2 %   Eosinophils Absolute 0.1 0.0 - 0.5 K/uL   Basophils Relative 1 %   Basophils Absolute 0.1 0.0 - 0.1 K/uL   Immature Granulocytes 0 %   Abs Immature Granulocytes 0.02 0.00 - 0.07 K/uL    Comment: Performed  at [facility], [address] [place], [place] [address]  HIV Antibody (routine testing w rflx)     Status: None   Collection Time: 07/05/23  3:20 AM  Result Value Ref Range   HIV Screen 4th Generation wRfx Non Reactive Non Reactive    Comment: Performed at [facility], [address], [place], [place] [address]  Phosphorus     Status: None   Collection Time: 07/05/23  3:20 AM  Result Value Ref Range   Phosphorus 4.6 2.5 - 4.6 mg/dL    Comment: Performed at [facility], [address] [place], [place] [address]  Urinalysis, Complete w Microscopic -Urine, Clean Catch     Status: Abnormal   Collection Time: 07/05/23  3:58 AM  Result Value Ref Range   Color, Urine AMBER (A) YELLOW    Comment: BIOCHEMICALS MAY BE AFFECTED BY COLOR   APPearance CLOUDY (A) CLEAR   Specific Gravity, Urine 1.028 1.005 - 1.030   pH 5.0 5.0 - 8.0   Glucose, UA NEGATIVE NEGATIVE mg/dL   Hgb urine dipstick NEGATIVE NEGATIVE   Bilirubin Urine NEGATIVE NEGATIVE   Ketones, ur NEGATIVE NEGATIVE mg/dL   Protein, ur 30 (A) NEGATIVE mg/dL   Nitrite NEGATIVE NEGATIVE    Leukocytes,Ua LARGE (A) NEGATIVE   RBC / HPF 21-50 0 - 5 RBC/hpf   WBC, UA 11-20 0 - 5 WBC/hpf   Bacteria, UA FEW (A) NONE SEEN   Squamous Epithelial / HPF 11-20 0 - 5 /HPF   Mucus PRESENT    Hyaline Casts, UA PRESENT     Comment: Performed at [facility], [address], [place], [place] [address]  CBC with Differential     Status: Abnormal   Collection Time: 07/05/23  7:42 AM  Result Value Ref Range   WBC 5.2 4.0 - 10.5 K/uL   RBC 4.26 3.87 - 5.11 MIL/uL   Hemoglobin 8.0 (L) 12.0 - 15.0 g/dL    Comment: Reticulocyte Hemoglobin testing may be clinically indicated, consider ordering this additional test [id number]    HCT 28.3 (L) 36.0 - 46.0 %   MCV 66.4 (L) 80.0 - 100.0 fL   MCH 18.8 (L) 26.0 - 34.0 pg   MCHC 28.3 (L) 30.0 - 36.0 g/dL   RDW 74.2 (H) 59.5 - 63.8 %   Platelets 540 (H) 150 - 400 K/uL    Comment: REPEATED TO VERIFY   nRBC 0.0 0.0 - 0.2 %   Neutrophils Relative % 58 %   Neutro Abs 3.0 1.7 - 7.7 K/uL   Lymphocytes Relative 35 %   Lymphs Abs 1.8 0.7 - 4.0 K/uL   Monocytes Relative 3 %   Monocytes Absolute 0.2 0.1 - 1.0 K/uL   Eosinophils Relative 3 %   Eosinophils Absolute 0.2 0.0 - 0.5 K/uL   Basophils Relative 1 %   Basophils Absolute 0.1 0.0 - 0.1 K/uL   WBC Morphology See Note     Comment: Morphology unremarkable   Smear Review See Note     Comment: Platelets Appear Increased.   nRBC 0 0 /100 WBC   Abs Immature Granulocytes 0.00 0.00 - 0.07 K/uL   Polychromasia PRESENT    Ovalocytes PRESENT     Comment: Performed at [facility], [address], [place], [place] [address]  CBC with Differential/Platelet     Status: Abnormal   Collection Time: 07/06/23  5:46 AM  Result Value Ref Range   WBC  6.1 4.0 - 10.5 K/uL   RBC 4.35 3.87 - 5.11 MIL/uL   Hemoglobin 8.3 (L) 12.0 - 15.0 g/dL    Comment: Reticulocyte Hemoglobin testing may be clinically indicated, consider ordering this additional test [id number]    HCT 28.2 (L) 36.0 - 46.0 %   MCV  64.8 (L) 80.0 - 100.0 fL   MCH 19.1 (L) 26.0 - 34.0 pg   MCHC 29.4 (L) 30.0 - 36.0 g/dL   RDW 54.0 (H) 98.1 - 19.1 %   Platelets 536 (H) 150 - 400 K/uL    Comment: REPEATED TO VERIFY   nRBC 0.0 0.0 - 0.2 %   Neutrophils Relative % 61 %   Neutro Abs 3.7 1.7 - 7.7 K/uL   Lymphocytes Relative 28 %   Lymphs Abs 1.7 0.7 - 4.0 K/uL   Monocytes Relative 8 %   Monocytes Absolute 0.5 0.1 - 1.0 K/uL   Eosinophils Relative 2 %   Eosinophils Absolute 0.1 0.0 - 0.5 K/uL   Basophils Relative 1 %   Basophils Absolute 0.1 0.0 - 0.1 K/uL   WBC Morphology MORPHOLOGY UNREMARKABLE    RBC Morphology See Note    Smear Review PLATELETS APPEAR INCREASED    Immature Granulocytes 0 %   Abs Immature Granulocytes 0.02 0.00 - 0.07 K/uL   Polychromasia PRESENT    Ovalocytes PRESENT     Comment: Performed at [facility], [address] [place], [place] [address]    ECHOCARDIOGRAM COMPLETE Result Date: 07/05/2023    ECHOCARDIOGRAM REPORT   Patient Name:   Maureen Hensley Date of Exam: 07/05/2023 Medical Rec #:  [id number]      Height:       62.0 in Accession #:    [id number]     Weight:       135.0 lb Date of Birth:  October 22, 1985      BSA:          1.618 m Patient Age:    37 years       BP:           106/72 mmHg Patient Gender: F              HR:           64 bpm. Exam Location:  Inpatient Procedure: 2D Echo, Cardiac Doppler and Color Doppler (Both Spectral and Color            Flow Doppler were utilized during procedure). Indications:    Chest Pain R07.9  History:        Patient has no prior history of Echocardiogram examinations.                 Signs/Symptoms:Chest Pain.  Sonographer:    Lucendia Herrlich RCS Referring Phys: [id number] ANASTASSIA DOUTOVA IMPRESSIONS  1. Left ventricular ejection fraction, by estimation, is 60 to 65%. Left ventricular ejection fraction by PLAX is 64 %. The left ventricle has normal function. The left ventricle has no regional wall motion abnormalities. Left ventricular diastolic parameters  were normal.  2. Right ventricular systolic function is normal. The right ventricular size is normal. There is normal pulmonary artery systolic pressure. The estimated right ventricular systolic pressure is 28.2 mmHg.  3. Left atrial size was mildly dilated.  4. The mitral valve is grossly normal. Trivial mitral valve regurgitation.  5. The tricuspid valve is abnormal. Tricuspid valve regurgitation is mild to moderate.  6. The aortic valve is tricuspid. Aortic valve regurgitation is not  visualized. No aortic stenosis is present.  7. The inferior vena cava is normal in size with <50% respiratory variability, suggesting right atrial pressure of 8 mmHg. Comparison(s): No prior Echocardiogram. FINDINGS  Left Ventricle: Left ventricular ejection fraction, by estimation, is 60 to 65%. Left ventricular ejection fraction by PLAX is 64 %. The left ventricle has normal function. The left ventricle has no regional wall motion abnormalities. Strain imaging was  not performed. The left ventricular internal cavity size was normal in size. There is no left ventricular hypertrophy. Left ventricular diastolic parameters were normal. Right Ventricle: The right ventricular size is normal. No increase in right ventricular wall thickness. Right ventricular systolic function is normal. There is normal pulmonary artery systolic pressure. The tricuspid regurgitant velocity is 2.25 m/s, and  with an assumed right atrial pressure of 8 mmHg, the estimated right ventricular systolic pressure is 28.2 mmHg. Left Atrium: Left atrial size was mildly dilated. Right Atrium: Right atrial size was normal in size. Pericardium: There is no evidence of pericardial effusion. Mitral Valve: The mitral valve is grossly normal. Trivial mitral valve regurgitation. Tricuspid Valve: The tricuspid valve is abnormal. Tricuspid valve regurgitation is mild to moderate. Aortic Valve: The aortic valve is tricuspid. Aortic valve regurgitation is not visualized. No  aortic stenosis is present. Aortic valve peak gradient measures 5.1 mmHg. Pulmonic Valve: The pulmonic valve was grossly normal. Pulmonic valve regurgitation is trivial. Aorta: The aortic root and ascending aorta are structurally normal, with no evidence of dilitation. Venous: The inferior vena cava is normal in size with less than 50% respiratory variability, suggesting right atrial pressure of 8 mmHg. IAS/Shunts: No atrial level shunt detected by color flow Doppler. Additional Comments: 3D imaging was not performed.  LEFT VENTRICLE PLAX 2D LV EF:         Left            Diastology                ventricular     LV e' medial:    14.80 cm/s                ejection        LV E/e' medial:  3.6                fraction by     LV e' lateral:   15.10 cm/s                PLAX is 64      LV E/e' lateral: 3.6                %. LVIDd:         4.60 cm LVIDs:         3.00 cm LV PW:         0.90 cm LV IVS:        0.80 cm LVOT diam:     2.30 cm LV SV:         86 LV SV Index:   53 LVOT Area:     4.15 cm  RIGHT VENTRICLE             IVC RV S prime:     12.40 cm/s  IVC diam: 2.00 cm TAPSE (M-mode): 1.7 cm LEFT ATRIUM             Index        RIGHT ATRIUM           Index LA diam:  3.20 cm 1.98 cm/m   RA Area:     20.55 cm LA Vol (A2C):   55.1 ml 34.06 ml/m  RA Volume:   59.55 ml  36.81 ml/m LA Vol (A4C):   60.3 ml 37.28 ml/m LA Biplane Vol: 62.4 ml 38.58 ml/m  AORTIC VALVE AV Area (Vmax): 3.75 cm AV Vmax:        113.00 cm/s AV Peak Grad:   5.1 mmHg LVOT Vmax:      102.00 cm/s LVOT Vmean:     65.200 cm/s LVOT VTI:       0.208 m  AORTA Ao Root diam: 3.30 cm Ao Asc diam:  3.40 cm MITRAL VALVE               TRICUSPID VALVE MV Area (PHT): 3.77 cm    TR Peak grad:   20.2 mmHg MV Decel Time: 201 msec    TR Vmax:        225.00 cm/s MV E velocity: 54.00 cm/s MV A velocity: 40.80 cm/s  SHUNTS MV E/A ratio:  1.32        Systemic VTI:  0.21 m                            Systemic Diam: 2.30 cm Zoila Shutter MD Electronically signed  by Zoila Shutter MD Signature Date/Time: 07/05/2023/10:37:26 AM    Final    DG Chest 2 View Result Date: 07/04/2023 CLINICAL DATA:  CP EXAM: CHEST - 2 VIEW COMPARISON:  None Available. FINDINGS: The heart and mediastinal contours are within normal limits. No focal consolidation. No pulmonary edema. No pleural effusion. No pneumothorax. No acute osseous abnormality. IMPRESSION: No active cardiopulmonary disease. Electronically Signed   By: Tish Frederickson M.D.   On: 07/04/2023 21:37    Review of Systems  HENT:  Negative for ear discharge, ear pain, hearing loss and tinnitus.   Eyes:  Negative for photophobia and pain.  Respiratory:  Negative for cough and shortness of breath.   Cardiovascular:  Negative for chest pain.  Gastrointestinal:  Negative for abdominal pain, nausea and vomiting.  Genitourinary:  Negative for dysuria, flank pain, frequency and urgency.  Musculoskeletal:  Positive for arthralgias (Right long finger). Negative for back pain, myalgias and neck pain.  Neurological:  Negative for dizziness and headaches.  Hematological:  Does not bruise/bleed easily.  Psychiatric/Behavioral:  The patient is not nervous/anxious.    Blood pressure 120/78, pulse 66, temperature 97.7 F (36.5 C), temperature source Oral, resp. rate 18, height 5\' 2"  (1.575 m), weight 61.2 kg, last menstrual period 06/27/2023, SpO2 98%. Physical Exam Constitutional:      General: She is not in acute distress.    Appearance: She is well-developed. She is not diaphoretic.  HENT:     Head: Normocephalic and atraumatic.  Eyes:     General: No scleral icterus.       Right eye: No discharge.        Left eye: No discharge.     Conjunctiva/sclera: Conjunctivae normal.  Cardiovascular:     Rate and Rhythm: Normal rate and regular rhythm.  Pulmonary:     Effort: Pulmonary effort is normal. No respiratory distress.  Musculoskeletal:     Cervical back: Normal range of motion.     Comments: Right shoulder, elbow,  wrist, digits- no skin wounds, long finger paronychia ulnar aspect, mild TTP, no instability, no blocks to motion  Sens  Ax/R/M/U intact  Mot  Ax/ R/ PIN/ M/ AIN/ U intact  Rad 2+  Skin:    General: Skin is warm and dry.  Neurological:     Mental Status: She is alert.  Psychiatric:        Mood and Affect: Mood normal.        Behavior: Behavior normal.     Assessment/Plan: Right long finger paronychia -- Will I&D.    Freeman Caldron, PA-C Orthopedic Surgery [phone] 07/06/2023, 10:53 AM

## 2023-07-09 ENCOUNTER — Encounter (HOSPITAL_COMMUNITY): Payer: Self-pay | Admitting: Emergency Medicine

## 2023-07-09 ENCOUNTER — Emergency Department (HOSPITAL_COMMUNITY)
Admission: EM | Admit: 2023-07-09 | Discharge: 2023-07-10 | Disposition: A | Discharge status: Home / Self Care | Attending: Emergency Medicine | Admitting: Emergency Medicine

## 2023-07-09 ENCOUNTER — Other Ambulatory Visit: Payer: Self-pay

## 2023-07-09 ENCOUNTER — Emergency Department (HOSPITAL_COMMUNITY)

## 2023-07-09 DIAGNOSIS — R112 Nausea with vomiting, unspecified: Secondary | ICD-10-CM | POA: Diagnosis not present

## 2023-07-09 DIAGNOSIS — R0789 Other chest pain: Secondary | ICD-10-CM | POA: Insufficient documentation

## 2023-07-09 DIAGNOSIS — R079 Chest pain, unspecified: Secondary | ICD-10-CM | POA: Diagnosis not present

## 2023-07-09 LAB — CBC
HCT: 33.3 % — ABNORMAL LOW (ref 36.0–46.0)
Hemoglobin: 9.5 g/dL — ABNORMAL LOW (ref 12.0–15.0)
MCH: 18.7 pg — ABNORMAL LOW (ref 26.0–34.0)
MCHC: 28.5 g/dL — ABNORMAL LOW (ref 30.0–36.0)
MCV: 65.6 fL — ABNORMAL LOW (ref 80.0–100.0)
Platelets: 359 10*3/uL (ref 150–400)
RBC: 5.08 MIL/uL (ref 3.87–5.11)
RDW: 29.7 % — ABNORMAL HIGH (ref 11.5–15.5)
WBC: 9.5 10*3/uL (ref 4.0–10.5)
nRBC: 0 % (ref 0.0–0.2)

## 2023-07-09 LAB — BASIC METABOLIC PANEL
Anion gap: 17 — ABNORMAL HIGH (ref 5–15)
BUN: 10 mg/dL (ref 6–20)
CO2: 19 mmol/L — ABNORMAL LOW (ref 22–32)
Calcium: 9.5 mg/dL (ref 8.9–10.3)
Chloride: 102 mmol/L (ref 98–111)
Creatinine, Ser: 0.87 mg/dL (ref 0.44–1.00)
GFR, Estimated: >60 mL/min (ref >60)
Glucose, Bld: 101 mg/dL — ABNORMAL HIGH (ref 70–99)
Potassium: 3.8 mmol/L (ref 3.5–5.1)
Sodium: 138 mmol/L (ref 135–145)

## 2023-07-09 LAB — HCG, SERUM, QUALITATIVE: Preg, Serum: NEGATIVE

## 2023-07-09 LAB — TROPONIN I (HIGH SENSITIVITY): Troponin I (High Sensitivity): 3 ng/L (ref <18)

## 2023-07-09 LAB — LIPASE, BLOOD: Lipase: 30 U/L (ref 11–51)

## 2023-07-09 NOTE — ED Triage Notes (Signed)
 Patient complaining centralized sharp chest pain since last night. Patient also reports emesis x1 day with inability to keep food/fluids down. Recently discharged from hospital for anemia and unable to keep prescribed medications down.

## 2023-07-10 ENCOUNTER — Emergency Department (HOSPITAL_COMMUNITY)

## 2023-07-10 DIAGNOSIS — R079 Chest pain, unspecified: Secondary | ICD-10-CM | POA: Diagnosis not present

## 2023-07-10 LAB — D-DIMER, QUANTITATIVE: D-Dimer, Quant: 0.54 ug{FEU}/mL — ABNORMAL HIGH (ref 0.00–0.50)

## 2023-07-10 LAB — TROPONIN I (HIGH SENSITIVITY): Troponin I (High Sensitivity): 4 ng/L (ref <18)

## 2023-07-10 MED ORDER — PANTOPRAZOLE SODIUM 20 MG PO TBEC: 20.0000 mg | DELAYED_RELEASE_TABLET | Freq: Every day | ORAL | Status: DC

## 2023-07-10 MED ORDER — SODIUM CHLORIDE 0.9 % IV BOLUS
1000.0000 mL | Freq: Once | INTRAVENOUS | Status: AC
  Administered 2023-07-10: 1000 mL via INTRAVENOUS

## 2023-07-10 MED ORDER — IOHEXOL 350 MG/ML SOLN
64.0000 mL | Freq: Once | INTRAVENOUS | Status: AC | PRN
Start: 2023-07-10 — End: 2023-07-10
  Administered 2023-07-10: 64 mL via INTRAVENOUS

## 2023-07-10 MED ORDER — SUCRALFATE 1 G PO TABS
1.0000 g | ORAL_TABLET | Freq: Once | ORAL | Status: AC
  Administered 2023-07-10: 1 g via ORAL
  Filled 2023-07-10: qty 1

## 2023-07-10 MED ORDER — ONDANSETRON 4 MG PO TBDP: 4.0000 mg | ORAL_TABLET | Freq: Three times a day (TID) | ORAL | 0 refills | Status: DC | PRN

## 2023-07-10 MED ORDER — ONDANSETRON HCL 4 MG/2ML IJ SOLN
4.0000 mg | Freq: Once | INTRAMUSCULAR | Status: AC
  Administered 2023-07-10: 4 mg via INTRAVENOUS
  Filled 2023-07-10: qty 2

## 2023-07-10 MED ORDER — PANTOPRAZOLE SODIUM 20 MG PO TBEC: 20.0000 mg | DELAYED_RELEASE_TABLET | Freq: Every day | ORAL | 0 refills | Status: DC

## 2023-07-10 MED ORDER — PANTOPRAZOLE SODIUM 20 MG PO TBEC
20.0000 mg | DELAYED_RELEASE_TABLET | Freq: Every day | ORAL | Status: DC
  Administered 2023-07-10: 20 mg via ORAL
  Filled 2023-07-10: qty 1

## 2023-07-10 MED ORDER — ALUM & MAG HYDROXIDE-SIMETH 200-200-20 MG/5ML PO SUSP
30.0000 mL | Freq: Once | ORAL | Status: AC
  Administered 2023-07-10: 30 mL via ORAL
  Filled 2023-07-10: qty 30

## 2023-07-10 NOTE — Discharge Instructions (Signed)
 Evaluation today was overall reassuring.  Suspect her symptoms are likely more related to reflux.  Advised that you start taking Protonix.  Also recommending follow-up with PCP to reevaluate symptoms.  You may need follow-up with GI if your symptoms persist.  If you develop worsening chest pain, shortness of breath, abdominal pain, fever, blood in the vomit or stool or any other concerning symptom please return emergency department further evaluation.

## 2023-07-10 NOTE — ED Provider Notes (Signed)
 [place] EMERGENCY DEPARTMENT AT [facility] Provider Note   CSN: [id number] Arrival date & time: 07/09/23  1928     History  Chief Complaint  Patient presents with   Chest Pain   Emesis   HPI Maureen Hensley is a 38 y.o. female with history of iron deficient anemia, menorrhagia presenting for chest pain and emesis.  States symptoms started 2 days ago.  Chest pain is central and nonradiating otherwise.  Denies associated shortness of breath.  Feels like a burning pain.  States it is certainly worse with eating and vomiting.  The pain is intermittent.  States in the last days she has not been able to keep any food or fluids down.  States she was recently admitted for symptomatic anemia.  Denies any blood in the vomit.  Denies urinary symptoms.  States she had a normal bowel movement yesterday.   Chest Pain Associated symptoms: vomiting   Emesis      Home Medications Prior to Admission medications   Medication Sig Start Date End Date Taking? Authorizing Provider  ferrous sulfate 325 (65 FE) MG tablet Take 1 tablet (325 mg total) by mouth 2 (two) times daily with a meal. 07/06/23  Yes Leroy Sea, MD  folic acid (FOLVITE) 1 MG tablet Take 1 tablet (1 mg total) by mouth daily. 07/07/23  Yes Leroy Sea, MD  naproxen (NAPROSYN) 500 MG tablet Take 1 tablet (500 mg total) by mouth every 12 (twelve) hours as needed for moderate pain (pain score 4-6) or mild pain (pain score 1-3). 07/01/23  Yes Amyot, Ali Lowe, NP  ondansetron (ZOFRAN-ODT) 4 MG disintegrating tablet Take 1 tablet (4 mg total) by mouth every 8 (eight) hours as needed for nausea or vomiting. 07/10/23  Yes Gareth Eagle, PA-C  pantoprazole (PROTONIX) 20 MG tablet Take 1 tablet (20 mg total) by mouth daily. 07/10/23 08/09/23 Yes Gareth Eagle, PA-C  sulfamethoxazole-trimethoprim (BACTRIM DS) 800-160 MG tablet Take 1 tablet by mouth 2 (two) times daily. 07/06/23  Yes Leroy Sea, MD      Allergies     Patient has no known allergies.    Review of Systems   Review of Systems  Cardiovascular:  Positive for chest pain.  Gastrointestinal:  Positive for vomiting.    Physical Exam Updated Vital Signs BP 114/75   Pulse 78   Temp 98.3 F (36.8 C) (Oral)   Resp 18   Ht 5\' 2"  (1.575 m)   Wt 61.2 kg   LMP 06/27/2023   SpO2 100%   BMI 24.69 kg/m  Physical Exam Vitals and nursing note reviewed.  HENT:     Head: Normocephalic and atraumatic.     Mouth/Throat:     Mouth: Mucous membranes are moist.  Eyes:     General:        Right eye: No discharge.        Left eye: No discharge.     Conjunctiva/sclera: Conjunctivae normal.  Cardiovascular:     Rate and Rhythm: Normal rate and regular rhythm.     Pulses: Normal pulses.     Heart sounds: Normal heart sounds.  Pulmonary:     Effort: Pulmonary effort is normal.     Breath sounds: Normal breath sounds.  Abdominal:     General: Abdomen is flat. There is no distension.     Palpations: Abdomen is soft.     Tenderness: There is no abdominal tenderness.  Skin:    General:  Skin is warm and dry.  Neurological:     General: No focal deficit present.  Psychiatric:        Mood and Affect: Mood normal.     ED Results / Procedures / Treatments   Labs (all labs ordered are listed, but only abnormal results are displayed) Labs Reviewed  BASIC METABOLIC PANEL - Abnormal; Notable for the following components:      Result Value   CO2 19 (*)    Glucose, Bld 101 (*)    Anion gap 17 (*)    All other components within normal limits  CBC - Abnormal; Notable for the following components:   Hemoglobin 9.5 (*)    HCT 33.3 (*)    MCV 65.6 (*)    MCH 18.7 (*)    MCHC 28.5 (*)    RDW 29.7 (*)    All other components within normal limits  D-DIMER, QUANTITATIVE - Abnormal; Notable for the following components:   D-Dimer, Quant 0.54 (*)    All other components within normal limits  HCG, SERUM, QUALITATIVE  LIPASE, BLOOD  TROPONIN I (HIGH  SENSITIVITY)  TROPONIN I (HIGH SENSITIVITY)    EKG None  Radiology CT Angio Chest PE W/Cm &/Or Wo Cm Result Date: 07/10/2023 CLINICAL DATA:  Chest pain since last night. EXAM: CT ANGIOGRAPHY CHEST WITH CONTRAST TECHNIQUE: Multidetector CT imaging of the chest was performed using the standard protocol during bolus administration of intravenous contrast. Multiplanar CT image reconstructions and MIPs were obtained to evaluate the vascular anatomy. RADIATION DOSE REDUCTION: This exam was performed according to the departmental dose-optimization program which includes automated exposure control, adjustment of the mA and/or kV according to patient size and/or use of iterative reconstruction technique. CONTRAST:  64mL OMNIPAQUE IOHEXOL 350 MG/ML SOLN COMPARISON:  None Available. FINDINGS: Cardiovascular: Heart size upper normal to mildly increased. No substantial pericardial effusion. Enlargement of the pulmonary outflow tract/main pulmonary arteries suggests pulmonary arterial hypertension. There is no filling defect within the opacified pulmonary arteries to suggest the presence of an acute pulmonary embolus. Mediastinum/Nodes: No mediastinal lymphadenopathy. There is no hilar lymphadenopathy. The esophagus has normal imaging features. There is no axillary lymphadenopathy. Lungs/Pleura: No suspicious pulmonary nodule or mass. No focal airspace consolidation. No pleural effusion. Upper Abdomen: Visualized portion of the upper abdomen shows no acute findings. Musculoskeletal: No worrisome lytic or sclerotic osseous abnormality. Review of the MIP images confirms the above findings. IMPRESSION: 1. No CT evidence for acute pulmonary embolus. 2. Enlargement of the pulmonary outflow tract/main pulmonary arteries raises concern for pulmonary arterial hypertension. 3. No other acute findings in the chest. Electronically Signed   By: Kennith Center M.D.   On: 07/10/2023 05:08   DG Chest 2 View Result Date:  07/09/2023 CLINICAL DATA:  Chest pain EXAM: CHEST - 2 VIEW COMPARISON:  07/04/2023 FINDINGS: The heart size and mediastinal contours are within normal limits. Both lungs are clear. The visualized skeletal structures are unremarkable. IMPRESSION: No active cardiopulmonary disease. Electronically Signed   By: Alcide Clever M.D.   On: 07/09/2023 22:46    Procedures Procedures    Medications Ordered in ED Medications  pantoprazole (PROTONIX) EC tablet 20 mg (has no administration in time range)  alum & mag hydroxide-simeth (MAALOX/MYLANTA) 200-200-20 MG/5ML suspension 30 mL (has no administration in time range)  sucralfate (CARAFATE) tablet 1 g (has no administration in time range)  sodium chloride 0.9 % bolus 1,000 mL (1,000 mLs Intravenous New Bag/Given 07/10/23 0110)  ondansetron (ZOFRAN) injection 4 mg (  4 mg Intravenous Given 07/10/23 0109)  iohexol (OMNIPAQUE) 350 MG/ML injection 64 mL (64 mLs Intravenous Contrast Given 07/10/23 0228)    ED Course/ Medical Decision Making/ A&P                                 Medical Decision Making Amount and/or Complexity of Data Reviewed Labs: ordered. Radiology: ordered.  Risk Prescription drug management.   Initial Impression and Ddx 38 year old well-appearing female presenting for chest pain and emesis.  Exam was unremarkable.  DDx includes acute pancreatitis, PUD, ACS, PE, acute cholecystitis, reflux, other. Patient PMH that increases complexity of ED encounter:  history of iron deficient anemia  Interpretation of Diagnostics I independent reviewed and interpreted the labs as followed: anemia (but improved from last check 4 days ago), elevated d dimer  - I independently visualized the following imaging with scope of interpretation limited to determining acute life threatening conditions related to emergency care: CT angio chest, which revealed negative for PE, no acute findings on chest x-ray  -I personally reviewed and interpreted EKG which  revealed sinus tachycardia  Patient Reassessment and Ultimate Disposition/Management On reassessment, heart rate did improve after fluid resuscitation.  However given initial tachycardia with chest pain and elevated dimer, felt further evaluation was warranted with CTA of the chest which was fortunately negative for PE.  There was some suggestions of possible pulmonary hypertension.  Advised patient of the findings and advised her to follow-up with her PCP.  Suspect her chest pain and emesis is likely more related to reflux.  Treated with a GI cocktail.  Fluid challenge without complication.  Started her on Protonix.  Advised her to follow-up with her PCP as well for what is likely reflux.  Discussed return precautions.  Discharged in good condition.  Patient management required discussion with the following services or consulting groups:  None  Complexity of Problems Addressed Acute complicated illness or Injury  Additional Data Reviewed and Analyzed Further history obtained from: Past medical history and medications listed in the EMR and Prior ED visit notes  Patient Encounter Risk Assessment Prescriptions         Final Clinical Impression(s) / ED Diagnoses Final diagnoses:  Nausea and vomiting, unspecified vomiting type  Atypical chest pain    Rx / DC Orders ED Discharge Orders          Ordered    pantoprazole (PROTONIX) 20 MG tablet  Daily        07/10/23 0621    ondansetron (ZOFRAN-ODT) 4 MG disintegrating tablet  Every 8 hours PRN        07/10/23 0621              Gareth Eagle, PA-C 07/10/23 4332    Tilden Fossa, MD 07/10/23 [id number]

## 2023-07-11 ENCOUNTER — Other Ambulatory Visit: Payer: Self-pay

## 2023-07-11 ENCOUNTER — Emergency Department (HOSPITAL_COMMUNITY)

## 2023-07-11 ENCOUNTER — Encounter (HOSPITAL_COMMUNITY): Payer: Self-pay

## 2023-07-11 ENCOUNTER — Emergency Department (HOSPITAL_COMMUNITY)
Admission: EM | Admit: 2023-07-11 | Discharge: 2023-07-11 | Disposition: A | Discharge status: Home / Self Care | Attending: Emergency Medicine | Admitting: Emergency Medicine

## 2023-07-11 DIAGNOSIS — R112 Nausea with vomiting, unspecified: Secondary | ICD-10-CM | POA: Diagnosis not present

## 2023-07-11 DIAGNOSIS — K429 Umbilical hernia without obstruction or gangrene: Secondary | ICD-10-CM | POA: Diagnosis not present

## 2023-07-11 DIAGNOSIS — K279 Peptic ulcer, site unspecified, unspecified as acute or chronic, without hemorrhage or perforation: Secondary | ICD-10-CM | POA: Diagnosis not present

## 2023-07-11 DIAGNOSIS — R1013 Epigastric pain: Secondary | ICD-10-CM | POA: Diagnosis not present

## 2023-07-11 DIAGNOSIS — R0789 Other chest pain: Secondary | ICD-10-CM | POA: Diagnosis not present

## 2023-07-11 DIAGNOSIS — J111 Influenza due to unidentified influenza virus with other respiratory manifestations: Secondary | ICD-10-CM

## 2023-07-11 DIAGNOSIS — J101 Influenza due to other identified influenza virus with other respiratory manifestations: Secondary | ICD-10-CM | POA: Insufficient documentation

## 2023-07-11 DIAGNOSIS — R1110 Vomiting, unspecified: Secondary | ICD-10-CM | POA: Diagnosis not present

## 2023-07-11 DIAGNOSIS — R079 Chest pain, unspecified: Secondary | ICD-10-CM | POA: Diagnosis not present

## 2023-07-11 LAB — URINALYSIS, ROUTINE W REFLEX MICROSCOPIC
Bilirubin Urine: NEGATIVE
Glucose, UA: NEGATIVE mg/dL
Hgb urine dipstick: NEGATIVE
Ketones, ur: 80 mg/dL — AB
Leukocytes,Ua: NEGATIVE
Nitrite: NEGATIVE
Protein, ur: 100 mg/dL — AB
Specific Gravity, Urine: >1.046 — ABNORMAL HIGH (ref 1.005–1.030)
pH: 5 (ref 5.0–8.0)

## 2023-07-11 LAB — RAPID URINE DRUG SCREEN, HOSP PERFORMED
Amphetamines: NOT DETECTED
Barbiturates: NOT DETECTED
Benzodiazepines: NOT DETECTED
Cocaine: NOT DETECTED
Opiates: NOT DETECTED
Tetrahydrocannabinol: NOT DETECTED

## 2023-07-11 LAB — COMPREHENSIVE METABOLIC PANEL
ALT: 11 U/L (ref 0–44)
AST: 19 U/L (ref 15–41)
Albumin: 4.3 g/dL (ref 3.5–5.0)
Alkaline Phosphatase: 51 U/L (ref 38–126)
Anion gap: 17 — ABNORMAL HIGH (ref 5–15)
BUN: 9 mg/dL (ref 6–20)
CO2: 13 mmol/L — ABNORMAL LOW (ref 22–32)
Calcium: 10.3 mg/dL (ref 8.9–10.3)
Chloride: 100 mmol/L (ref 98–111)
Creatinine, Ser: 0.62 mg/dL (ref 0.44–1.00)
GFR, Estimated: >60 mL/min (ref >60)
Glucose, Bld: 108 mg/dL — ABNORMAL HIGH (ref 70–99)
Potassium: 4.2 mmol/L (ref 3.5–5.1)
Sodium: 130 mmol/L — ABNORMAL LOW (ref 135–145)
Total Bilirubin: 1.5 mg/dL — ABNORMAL HIGH (ref 0.0–1.2)
Total Protein: 8.4 g/dL — ABNORMAL HIGH (ref 6.5–8.1)

## 2023-07-11 LAB — CBC
HCT: 36.4 % (ref 36.0–46.0)
Hemoglobin: 10.2 g/dL — ABNORMAL LOW (ref 12.0–15.0)
MCH: 18.7 pg — ABNORMAL LOW (ref 26.0–34.0)
MCHC: 28 g/dL — ABNORMAL LOW (ref 30.0–36.0)
MCV: 66.7 fL — ABNORMAL LOW (ref 80.0–100.0)
Platelets: 289 10*3/uL (ref 150–400)
RBC: 5.46 MIL/uL — ABNORMAL HIGH (ref 3.87–5.11)
RDW: 29.4 % — ABNORMAL HIGH (ref 11.5–15.5)
WBC: 8.2 10*3/uL (ref 4.0–10.5)
nRBC: 0 % (ref 0.0–0.2)

## 2023-07-11 LAB — RESP PANEL BY RT-PCR (RSV, FLU A&B, COVID)  RVPGX2
Influenza A by PCR: POSITIVE — AB
Influenza B by PCR: NEGATIVE
Resp Syncytial Virus by PCR: NEGATIVE
SARS Coronavirus 2 by RT PCR: NEGATIVE

## 2023-07-11 LAB — TROPONIN I (HIGH SENSITIVITY): Troponin I (High Sensitivity): <2 ng/L (ref <18)

## 2023-07-11 LAB — HCG, SERUM, QUALITATIVE: Preg, Serum: NEGATIVE

## 2023-07-11 LAB — LIPASE, BLOOD: Lipase: 37 U/L (ref 11–51)

## 2023-07-11 MED ORDER — ONDANSETRON HCL 4 MG/2ML IJ SOLN
4.0000 mg | Freq: Once | INTRAMUSCULAR | Status: AC
  Administered 2023-07-11: 4 mg via INTRAVENOUS
  Filled 2023-07-11: qty 2

## 2023-07-11 MED ORDER — SUCRALFATE 1 G PO TABS: 1.0000 g | ORAL_TABLET | Freq: Three times a day (TID) | ORAL | 0 refills | Status: DC

## 2023-07-11 MED ORDER — PROMETHAZINE HCL 25 MG PO TABS: 25.0000 mg | ORAL_TABLET | Freq: Four times a day (QID) | ORAL | 0 refills | Status: DC | PRN

## 2023-07-11 MED ORDER — ALUM & MAG HYDROXIDE-SIMETH 200-200-20 MG/5ML PO SUSP
30.0000 mL | Freq: Once | ORAL | Status: AC
  Administered 2023-07-11: 30 mL via ORAL
  Filled 2023-07-11: qty 30

## 2023-07-11 MED ORDER — LIDOCAINE VISCOUS HCL 2 % MT SOLN
15.0000 mL | Freq: Once | OROMUCOSAL | Status: AC
  Administered 2023-07-11: 15 mL via ORAL
  Filled 2023-07-11: qty 15

## 2023-07-11 MED ORDER — DROPERIDOL 2.5 MG/ML IJ SOLN
1.2500 mg | Freq: Once | INTRAMUSCULAR | Status: AC
  Administered 2023-07-11: 1.25 mg via INTRAVENOUS
  Filled 2023-07-11: qty 2

## 2023-07-11 MED ORDER — SODIUM CHLORIDE 0.9 % IV BOLUS
1000.0000 mL | Freq: Once | INTRAVENOUS | Status: AC
  Administered 2023-07-11: 1000 mL via INTRAVENOUS

## 2023-07-11 NOTE — ED Triage Notes (Signed)
 Pt c/o N/Vx2d. Pt c/o midsternal chest pain from not being able to keep anything down.

## 2023-07-11 NOTE — ED Notes (Signed)
 Pt aware we need a urine sample, but unable to provide a sample at this time.

## 2023-07-11 NOTE — ED Notes (Signed)
 Pt has only been able to drink a few sips of ginger ale and eat half a graham cracker d/t nausea.

## 2023-07-11 NOTE — ED Notes (Signed)
 Pt given ginger ale and graham crackers x2.

## 2023-07-11 NOTE — Discharge Instructions (Addendum)
 Your nausea and vomiting is likely due to gastritis and influenza.  You may use the dissolvable Zofran tablets help with nausea vomiting or use of Phenergan tablets which I have written a prescription for.  I have written for an additional medication called Carafate which you will take 4 times daily for any possible ulcers in your stomach.  It is important to not take any anti-inflammatory such as ibuprofen, Aleve, naproxen or drink alcohol as this can make your symptoms worse.  Make sure to follow-up outpatient, return for any worsening symptoms.

## 2023-07-11 NOTE — ED Provider Notes (Signed)
 [facility] EMERGENCY DEPARTMENT AT [facility] Provider Note   CSN: [id number] Arrival date & time: 07/11/23  1358    History  Chief Complaint  Patient presents with   Emesis    Maureen Hensley is a 38 y.o. female here for evaluation of nausea, vomiting and gastric pain.  Was seen few days for similar.  Was tachycardic at that time and elevated D-dimer subsequently CTA was performed which showed some possible pulmonary hypertension otherwise was negative for acute process.  Patient returns today as she is unable to keep down her home meds.  Endorses chest pain however points to her epigastric region.  Has not been able to eat or drink.  No fever, shortness of breath, back pain, dysuria, hematuria, diarrhea.  No sick contacts.  Denies any EtOH, marijuana use. No pain or swelling to extremities.  No history of PE or DVT.  No recent surgery, immobilization, malignancy  HPI     Home Medications Prior to Admission medications   Medication Sig Start Date End Date Taking? Authorizing Provider  promethazine (PHENERGAN) 25 MG tablet Take 1 tablet (25 mg total) by mouth every 6 (six) hours as needed for nausea or vomiting. 07/11/23  Yes Dominick Morella A, PA-C  sucralfate (CARAFATE) 1 g tablet Take 1 tablet (1 g total) by mouth 4 (four) times daily -  with meals and at bedtime for 14 days. 07/11/23 07/25/23 Yes Mutasim Tuckey A, PA-C  ferrous sulfate 325 (65 FE) MG tablet Take 1 tablet (325 mg total) by mouth 2 (two) times daily with a meal. 07/06/23   Leroy Sea, MD  folic acid (FOLVITE) 1 MG tablet Take 1 tablet (1 mg total) by mouth daily. 07/07/23   Leroy Sea, MD  naproxen (NAPROSYN) 500 MG tablet Take 1 tablet (500 mg total) by mouth every 12 (twelve) hours as needed for moderate pain (pain score 4-6) or mild pain (pain score 1-3). 07/01/23   Amyot, Ali Lowe, NP  ondansetron (ZOFRAN-ODT) 4 MG disintegrating tablet Take 1 tablet (4 mg total) by mouth every 8 (eight) hours as  needed for nausea or vomiting. 07/10/23   Gareth Eagle, PA-C  pantoprazole (PROTONIX) 20 MG tablet Take 1 tablet (20 mg total) by mouth daily. 07/10/23 08/09/23  Gareth Eagle, PA-C  sulfamethoxazole-trimethoprim (BACTRIM DS) 800-160 MG tablet Take 1 tablet by mouth 2 (two) times daily. 07/06/23   Leroy Sea, MD      Allergies    Patient has no known allergies.    Review of Systems   Review of Systems  Constitutional: Negative.   HENT: Negative.    Respiratory: Negative.    Cardiovascular:  Positive for chest pain ((lower chest/epigastric tenderness)).  Gastrointestinal:  Positive for abdominal pain, nausea and vomiting. Negative for abdominal distention, anal bleeding, blood in stool, constipation and diarrhea.  Genitourinary: Negative.   Musculoskeletal: Negative.   Neurological: Negative.   All other systems reviewed and are negative.   Physical Exam Updated Vital Signs BP 127/84   Pulse 100   Temp 98.1 F (36.7 C) (Oral)   Resp (!) 25   Ht 5\' 2"  (1.575 m)   Wt 61.2 kg   LMP 06/27/2023   SpO2 100%   BMI 24.68 kg/m  Physical Exam Vitals and nursing note reviewed.  Constitutional:      General: She is not in acute distress.    Appearance: She is well-developed. She is not ill-appearing, toxic-appearing or diaphoretic.  HENT:  Head: Normocephalic and atraumatic.     Nose: Nose normal.     Mouth/Throat:     Mouth: Mucous membranes are moist.  Eyes:     Pupils: Pupils are equal, round, and reactive to light.  Cardiovascular:     Rate and Rhythm: Normal rate.     Pulses: Normal pulses.     Heart sounds: Normal heart sounds.  Pulmonary:     Effort: Pulmonary effort is normal. No respiratory distress.     Breath sounds: Normal breath sounds.  Chest:     Comments: Non tender chest wall, no crepitus, step off Abdominal:     General: Bowel sounds are normal. There is no distension.     Palpations: Abdomen is soft. There is no mass.     Tenderness: There is  abdominal tenderness. There is no right CVA tenderness, left CVA tenderness, guarding or rebound.     Hernia: No hernia is present.     Comments: Tenderness to epigastric region, Neg Murphy sign  Musculoskeletal:        General: Normal range of motion.     Cervical back: Normal range of motion.     Comments: No bony tenderness, FROM  Skin:    General: Skin is warm and dry.     Capillary Refill: Capillary refill takes less than 2 seconds.     Comments: No rashes or lesions on exposed skin.  Neurological:     General: No focal deficit present.     Mental Status: She is alert.  Psychiatric:        Mood and Affect: Mood normal.    ED Results / Procedures / Treatments   Labs (all labs ordered are listed, but only abnormal results are displayed) Labs Reviewed  RESP PANEL BY RT-PCR (RSV, FLU A&B, COVID)  RVPGX2 - Abnormal; Notable for the following components:      Result Value   Influenza A by PCR POSITIVE (*)    All other components within normal limits  COMPREHENSIVE METABOLIC PANEL - Abnormal; Notable for the following components:   Sodium 130 (*)    CO2 13 (*)    Glucose, Bld 108 (*)    Total Protein 8.4 (*)    Total Bilirubin 1.5 (*)    Anion gap 17 (*)    All other components within normal limits  CBC - Abnormal; Notable for the following components:   RBC 5.46 (*)    Hemoglobin 10.2 (*)    MCV 66.7 (*)    MCH 18.7 (*)    MCHC 28.0 (*)    RDW 29.4 (*)    All other components within normal limits  URINALYSIS, ROUTINE W REFLEX MICROSCOPIC - Abnormal; Notable for the following components:   APPearance HAZY (*)    Specific Gravity, Urine >1.046 (*)    Ketones, ur 80 (*)    Protein, ur 100 (*)    Bacteria, UA RARE (*)    All other components within normal limits  LIPASE, BLOOD  HCG, SERUM, QUALITATIVE  RAPID URINE DRUG SCREEN, HOSP PERFORMED  TROPONIN I (HIGH SENSITIVITY)    EKG None  Radiology CT ABDOMEN PELVIS W CONTRAST Result Date: 07/11/2023 CLINICAL DATA:   Epigastric abdominal pain. EXAM: CT ABDOMEN AND PELVIS WITH CONTRAST TECHNIQUE: Multidetector CT imaging of the abdomen and pelvis was performed using the standard protocol following bolus administration of intravenous contrast. RADIATION DOSE REDUCTION: This exam was performed according to the departmental dose-optimization program which includes automated exposure control, adjustment  of the mA and/or kV according to patient size and/or use of iterative reconstruction technique. CONTRAST:  64mL OMNIPAQUE IOHEXOL 350 MG/ML SOLN COMPARISON:  None Available. FINDINGS: Lower chest: No acute abnormality. Hepatobiliary: No focal liver abnormality is seen. No gallstones, gallbladder wall thickening, or biliary dilatation. Pancreas: Unremarkable. No pancreatic ductal dilatation or surrounding inflammatory changes. Spleen: Normal in size without focal abnormality. Adrenals/Urinary Tract: Adrenal glands are unremarkable. Kidneys are normal, without renal calculi, focal lesion, or hydronephrosis. Bladder is unremarkable. Stomach/Bowel: Moderate antral wall thickening is noted suggesting possible gastritis or peptic ulcer disease. There is no evidence of bowel obstruction or inflammation. The appendix appears unremarkable. Vascular/Lymphatic: No significant vascular findings are present. No enlarged abdominal or pelvic lymph nodes. Reproductive: Uterus and bilateral adnexa are unremarkable. Other: Small amount of free fluid is noted in the pelvis which most likely is physiologic. Small fat containing periumbilical hernia is noted. Musculoskeletal: No acute or significant osseous findings. IMPRESSION: Moderate gastric antral wall thickening is noted suggesting possible gastritis or peptic ulcer disease. Endoscopy is recommended for further evaluation. Electronically Signed   By: Lupita Raider M.D.   On: 07/11/2023 16:55   DG Chest Portable 1 View Result Date: 07/11/2023 CLINICAL DATA:  Chest pain.  Emesis. EXAM: PORTABLE  CHEST 1 VIEW COMPARISON:  Radiograph 2 days ago.  CT yesterday FINDINGS: Upper normal heart size, as seen on yesterday's CT. Stable mediastinal contours. No focal airspace disease, pleural effusion, pulmonary edema or pneumothorax. Stable osseous structures. IMPRESSION: No acute chest findings or change from recent exams. Upper normal heart size. Electronically Signed   By: Narda Rutherford M.D.   On: 07/11/2023 16:28   CT Angio Chest PE W/Cm &/Or Wo Cm Result Date: 07/10/2023 CLINICAL DATA:  Chest pain since last night. EXAM: CT ANGIOGRAPHY CHEST WITH CONTRAST TECHNIQUE: Multidetector CT imaging of the chest was performed using the standard protocol during bolus administration of intravenous contrast. Multiplanar CT image reconstructions and MIPs were obtained to evaluate the vascular anatomy. RADIATION DOSE REDUCTION: This exam was performed according to the departmental dose-optimization program which includes automated exposure control, adjustment of the mA and/or kV according to patient size and/or use of iterative reconstruction technique. CONTRAST:  64mL OMNIPAQUE IOHEXOL 350 MG/ML SOLN COMPARISON:  None Available. FINDINGS: Cardiovascular: Heart size upper normal to mildly increased. No substantial pericardial effusion. Enlargement of the pulmonary outflow tract/main pulmonary arteries suggests pulmonary arterial hypertension. There is no filling defect within the opacified pulmonary arteries to suggest the presence of an acute pulmonary embolus. Mediastinum/Nodes: No mediastinal lymphadenopathy. There is no hilar lymphadenopathy. The esophagus has normal imaging features. There is no axillary lymphadenopathy. Lungs/Pleura: No suspicious pulmonary nodule or mass. No focal airspace consolidation. No pleural effusion. Upper Abdomen: Visualized portion of the upper abdomen shows no acute findings. Musculoskeletal: No worrisome lytic or sclerotic osseous abnormality. Review of the MIP images confirms the above  findings. IMPRESSION: 1. No CT evidence for acute pulmonary embolus. 2. Enlargement of the pulmonary outflow tract/main pulmonary arteries raises concern for pulmonary arterial hypertension. 3. No other acute findings in the chest. Electronically Signed   By: Kennith Center M.D.   On: 07/10/2023 05:08   DG Chest 2 View Result Date: 07/09/2023 CLINICAL DATA:  Chest pain EXAM: CHEST - 2 VIEW COMPARISON:  07/04/2023 FINDINGS: The heart size and mediastinal contours are within normal limits. Both lungs are clear. The visualized skeletal structures are unremarkable. IMPRESSION: No active cardiopulmonary disease. Electronically Signed   By: Eulah Pont.D.  On: 07/09/2023 22:46    Procedures Procedures    Medications Ordered in ED Medications  sodium chloride 0.9 % bolus 1,000 mL (0 mLs Intravenous Stopped 07/11/23 1742)  droperidol (INAPSINE) 2.5 MG/ML injection 1.25 mg (1.25 mg Intravenous Given 07/11/23 1623)  alum & mag hydroxide-simeth (MAALOX/MYLANTA) 200-200-20 MG/5ML suspension 30 mL (30 mLs Oral Given 07/11/23 1726)    And  lidocaine (XYLOCAINE) 2 % viscous mouth solution 15 mL (15 mLs Oral Given 07/11/23 1726)  ondansetron (ZOFRAN) injection 4 mg (4 mg Intravenous Given 07/11/23 1853)   ED Course/ Medical Decision Making/ A&P   Here for nausea and vomiting.  Sounds like some going on for a few days.  She was initially seen in the ED 2 days ago noted some chest pain at that time.  She had elevated D-dimer and subsequently underwent a CTA of her chest which showed possibly some pulmonary hypertension.  She was discharged home.  Continue to have nausea and vomiting unable to keep down medications she was discharged home with.  She does endorse some chest pain however when you ask her to point this seems more in the epigastric region.  She has no crepitus to her chest wall to suggest perforated esophagus/Boerhaave's as cause of her vomiting.  Will check with chest x-ray.  Will also plan on repeating her  labs.  She denies any EtOH use, marijuana use.  She had a recent negative pregnancy test.  No urinary symptoms, diarrhea.  No sick contacts.  Labs and imaging personally viewed and interpreted:  EKG without ischemic changes, QTc within normal limits Pregnancy test negative Lipase 37 Troponin CBC without significant abnormality Metabolic panel sodium 130, T. bili 1.5, similar to prior, anion gap 17 CT abdomen pelvis with possible gastritis versus PUD.  No melena, chronic NSAID use, chronic EtOH use. Chest x-ray without cardiomegaly, pulm edema, pneumothorax, free air  Patient reassessed and feels improved.  We discussed her labs and imaging.  Still awaiting urine.  We did discuss positive influenza test.  No free air on CT abdomen pelvis or chest x-ray to suggest perforated viscus.  Will also give GI cocktail.  No BRBPR or melena.  Patient reassessed.  Had a low bit more nausea after having a graham cracker.  Will give additional Zofran  Patient reassessed.  No emesis.  I suspect her symptoms likely due to influenza versus gastritis versus PUD.  No complicating factors to suggest bleeding ulcer, perforation.  Repeat benign abdominal exam.  Tolerating p.o. intake.  Pain controlled.  Will add on Carafate.  Long discussion no EtOH, NSAIDs, steroids until she can follow-up with gastroenterology, PCP, given possible read for PUD.  Will have her follow-up outpatient, return for any worsening symptoms.  Patient is nontoxic, nonseptic appearing, in no apparent distress.  Patient's pain and other symptoms adequately managed in emergency department.  Fluid bolus given.  Labs, imaging and vitals reviewed.  Patient does not meet the SIRS or Sepsis criteria.  On repeat exam patient does not have a surgical abdomin and there are no peritoneal signs.  No indication of appendicitis, bowel obstruction, bowel perforation, cholecystitis, diverticulitis, PID, intermittent/persistent torsion, TOA, ectopic pregnancy,  PE, unstable angina, dissection, pneumonia, myocarditis, pericarditis, endocarditis, ruptured esophagus.  Patient discharged home with symptomatic treatment and given strict instructions for follow-up with their primary care physician.  I have also discussed reasons to return immediately to the ER.  Patient expresses understanding and agrees with plan.  Medical Decision Making Amount and/or Complexity of Data Reviewed Independent Historian: friend External Data Reviewed: labs, radiology, ECG and notes. Labs: ordered. Decision-making details documented in ED Course. Radiology: ordered and independent interpretation performed. Decision-making details documented in ED Course. ECG/medicine tests: ordered and independent interpretation performed. Decision-making details documented in ED Course.  Risk OTC drugs. Prescription drug management. Parenteral controlled substances. Decision regarding hospitalization. Diagnosis or treatment significantly limited by social determinants of health.          Final Clinical Impression(s) / ED Diagnoses Final diagnoses:  Influenza  Nausea and vomiting, unspecified vomiting type  PUD (peptic ulcer disease)    Rx / DC Orders ED Discharge Orders          Ordered    promethazine (PHENERGAN) 25 MG tablet  Every 6 hours PRN        07/11/23 1916    sucralfate (CARAFATE) 1 g tablet  3 times daily with meals & bedtime        07/11/23 1916              Evelen Vazguez A, PA-C 07/11/23 1926    Tegeler, Canary Brim, MD 07/11/23 2309

## 2023-07-11 NOTE — ED Notes (Signed)
 Pt states unable to urinate at this time.  IV fluids almost complete.

## 2023-07-11 NOTE — ED Notes (Signed)
Lab made aware of troponin add on

## 2023-08-24 ENCOUNTER — Ambulatory Visit: Admitting: Obstetrics and Gynecology

## 2024-01-06 ENCOUNTER — Telehealth: Payer: Self-pay | Admitting: *Deleted

## 2024-01-06 DIAGNOSIS — D509 Iron deficiency anemia, unspecified: Secondary | ICD-10-CM

## 2024-01-06 NOTE — Progress Notes (Unsigned)
 Complex Care Management Note Care Guide Note  01/06/2024 Name: Maureen Hensley MRN: [id number] DOB: [birth year]   Complex Care Management Outreach Attempts: An unsuccessful telephone outreach was attempted today to offer the patient information about available complex care management services.  Follow Up Plan:  Additional outreach attempts will be made to offer the patient complex care management information and services.   Encounter Outcome:  No Answer Harlene Satterfield  [facility]  [facility], [facility] Guide  Direct Dial: [phone]  Fax [phone]

## 2024-01-07 NOTE — Progress Notes (Signed)
 Complex Care Management Note  Care Guide Note 01/07/2024 Name: [name] MRN: [id number] DOB: 1986/04/03  Maureen Hensley is a 38 y.o. year old female who sees Patient, No Pcp Per for primary care. I reached out to Maureen Hensley by phone today to offer complex care management services.  [name] was given information about Complex Care Management services today including:   The Complex Care Management services include support from the care team which includes your Nurse Care Manager, Clinical Social Worker, or Pharmacist.  The Complex Care Management team is here to help remove barriers to the health concerns and goals most important to you. Complex Care Management services are voluntary, and the patient may decline or stop services at any time by request to their care team member.   Complex Care Management Consent Status: Patient agreed to services and verbal consent obtained.   Follow up plan:  Telephone appointment with complex care management team member scheduled for:  01/15/24  Encounter Outcome:  Patient Scheduled  Patient give physician line to establish a new provider. [phone]  [facility]  [facility], [facility] Guide  Direct Dial: [phone]  Fax [phone]

## 2024-01-15 ENCOUNTER — Telehealth: Payer: Self-pay

## 2024-01-15 NOTE — Patient Instructions (Signed)
 Maureen Hensley - I am sorry I was unable to reach you today for our scheduled appointment. I work with [employer] and am calling to support your healthcare needs. Please contact me at [phone] at your earliest convenience. I look forward to speaking with you soon.   Thank you,  Hendricks Her RN, BSN  [place] [facility] RN Case Manager   Direct [phone]

## 2024-01-20 ENCOUNTER — Other Ambulatory Visit: Payer: Self-pay

## 2024-01-20 NOTE — Patient Instructions (Signed)
 Lauraine LITTIE Birmingham - I am sorry I was unable to reach you today for our scheduled appointment. I work with Patient, No Pcp Per and am calling to support your healthcare needs. Please contact me at [phone] at your earliest convenience. I look forward to speaking with you soon.   Thank you,   Hendricks Her RN, BSN  [place] [facility] RN Case Manager   Direct [phone]

## 2024-01-26 ENCOUNTER — Telehealth: Payer: Self-pay | Admitting: *Deleted

## 2024-01-26 NOTE — Progress Notes (Unsigned)
 Complex Care Management Care Guide Note  01/26/2024 Name: DESTRY BEZDEK MRN: [id number] DOB: Oct 04, 1985  Lauraine LITTIE Birmingham is a 38 y.o. year old female who is a primary care patient of Patient, No Pcp Per and is actively engaged with the care management team. I reached out to Lauraine LITTIE Birmingham by phone today to assist with re-scheduling  with the RN Case Manager.  Follow up plan: Unsuccessful telephone outreach attempt made. A HIPAA compliant phone message was left for the patient providing contact information and requesting a return call.  Harlene Satterfield  [facility]  [facility], [facility] Guide  Direct Dial: [phone]  Fax [phone]

## 2024-01-27 NOTE — Progress Notes (Signed)
 Complex Care Management Care Guide Note  01/27/2024 Name: Maureen Hensley MRN: [id number] DOB: [birth year]  Maureen Hensley is a 38 y.o. year old female who is a primary care patient of Patient, No Pcp Per and is actively engaged with the care management team. I reached out to Maureen Hensley by phone today to assist with re-scheduling  with the RN Case Manager.  Follow up plan: Unsuccessful telephone outreach attempt made. A HIPAA compliant phone message was left for the patient providing contact information and requesting a return call. No further outreach attempts will be made at this time. We have been unable to contact the patient to reschedule for complex care management services.   Harlene Satterfield  [facility]  [facility], [facility] Guide  Direct Dial: [phone]  Fax [phone]

## 2024-04-05 ENCOUNTER — Observation Stay (HOSPITAL_COMMUNITY)
Admission: EM | Admit: 2024-04-05 | Discharge: 2024-04-07 | Disposition: A | Attending: Emergency Medicine | Admitting: Emergency Medicine

## 2024-04-05 ENCOUNTER — Other Ambulatory Visit: Payer: Self-pay

## 2024-04-05 DIAGNOSIS — D75839 Thrombocytosis, unspecified: Secondary | ICD-10-CM | POA: Diagnosis present

## 2024-04-05 DIAGNOSIS — K922 Gastrointestinal hemorrhage, unspecified: Principal | ICD-10-CM | POA: Diagnosis present

## 2024-04-05 DIAGNOSIS — R1010 Upper abdominal pain, unspecified: Secondary | ICD-10-CM

## 2024-04-05 DIAGNOSIS — D649 Anemia, unspecified: Secondary | ICD-10-CM | POA: Diagnosis present

## 2024-04-05 DIAGNOSIS — N920 Excessive and frequent menstruation with regular cycle: Secondary | ICD-10-CM | POA: Diagnosis present

## 2024-04-05 DIAGNOSIS — K2970 Gastritis, unspecified, without bleeding: Secondary | ICD-10-CM

## 2024-04-05 DIAGNOSIS — D62 Acute posthemorrhagic anemia: Secondary | ICD-10-CM | POA: Diagnosis present

## 2024-04-05 DIAGNOSIS — K625 Hemorrhage of anus and rectum: Secondary | ICD-10-CM

## 2024-04-05 LAB — COMPREHENSIVE METABOLIC PANEL WITH GFR
ALT: 11 U/L (ref 0–44)
AST: 16 U/L (ref 15–41)
Albumin: 3.6 g/dL (ref 3.5–5.0)
Alkaline Phosphatase: 37 U/L — ABNORMAL LOW (ref 38–126)
Anion gap: 8 (ref 5–15)
BUN: 11 mg/dL (ref 6–20)
CO2: 24 mmol/L (ref 22–32)
Calcium: 8.2 mg/dL — ABNORMAL LOW (ref 8.9–10.3)
Chloride: 104 mmol/L (ref 98–111)
Creatinine, Ser: 0.68 mg/dL (ref 0.44–1.00)
GFR, Estimated: >60 mL/min (ref >60)
Glucose, Bld: 111 mg/dL — ABNORMAL HIGH (ref 70–99)
Potassium: 3.5 mmol/L (ref 3.5–5.1)
Sodium: 136 mmol/L (ref 135–145)
Total Bilirubin: 0.5 mg/dL (ref 0.0–1.2)
Total Protein: 6 g/dL — ABNORMAL LOW (ref 6.5–8.1)

## 2024-04-05 LAB — CBC
HCT: 20.2 % — ABNORMAL LOW (ref 36.0–46.0)
Hemoglobin: 5 g/dL — CL (ref 12.0–15.0)
MCH: 14.6 pg — ABNORMAL LOW (ref 26.0–34.0)
MCHC: 24.8 g/dL — ABNORMAL LOW (ref 30.0–36.0)
MCV: 59.1 fL — ABNORMAL LOW (ref 80.0–100.0)
Platelets: 639 K/uL — ABNORMAL HIGH (ref 150–400)
RBC: 3.42 MIL/uL — ABNORMAL LOW (ref 3.87–5.11)
RDW: 26.7 % — ABNORMAL HIGH (ref 11.5–15.5)
WBC: 6.2 K/uL (ref 4.0–10.5)
nRBC: 0 % (ref 0.0–0.2)

## 2024-04-05 LAB — LIPASE, BLOOD: Lipase: 42 U/L (ref 11–51)

## 2024-04-05 LAB — HCG, SERUM, QUALITATIVE: Preg, Serum: NEGATIVE

## 2024-04-05 MED ORDER — ONDANSETRON 4 MG PO TBDP
4.0000 mg | ORAL_TABLET | Freq: Once | ORAL | Status: AC | PRN
  Administered 2024-04-05: 4 mg via ORAL
  Filled 2024-04-05: qty 1

## 2024-04-05 NOTE — ED Triage Notes (Signed)
 Pt c.o lower abd pain and bloody stools x 3 days. Pt nauseated at this time. Hx of anemia with blood transfusions

## 2024-04-05 NOTE — ED Notes (Signed)
 Pt vomiting while in the lobby, pt given PRN medication

## 2024-04-05 NOTE — ED Provider Notes (Signed)
  EMERGENCY DEPARTMENT AT [facility] Provider Note   CSN: [id number] Arrival date & time: 04/05/24  2230     History Chief Complaint  Patient presents with   Abdominal Pain   Blood In Stools    HPI Maureen Hensley is a 38 y.o. female presenting for chief complaint of BRBPR. States heavy cycle for 3 days then severe abdominal pain and bloody diarhea  Patient's recorded medical, surgical, social, medication list and allergies were reviewed in the Snapshot window as part of the initial history.   Review of Systems   Review of Systems  Constitutional:  Negative for chills and fever.  HENT:  Negative for ear pain and sore throat.   Eyes:  Negative for pain and visual disturbance.  Respiratory:  Negative for cough and shortness of breath.   Cardiovascular:  Negative for chest pain and palpitations.  Gastrointestinal:  Positive for abdominal pain, blood in stool, nausea and vomiting.  Genitourinary:  Negative for dysuria and hematuria.  Musculoskeletal:  Negative for arthralgias and back pain.  Skin:  Negative for color change and rash.  Neurological:  Negative for seizures and syncope.  All other systems reviewed and are negative.   Physical Exam Updated Vital Signs BP 106/63   Pulse 72   Temp 98.8 F (37.1 C)   Resp 15   LMP 03/28/2024 (Exact Date)   SpO2 100%  Physical Exam Vitals and nursing note reviewed.  Constitutional:      General: She is not in acute distress.    Appearance: She is well-developed. She is not ill-appearing or toxic-appearing.  HENT:     Head: Normocephalic and atraumatic.  Eyes:     Extraocular Movements: Extraocular movements intact.     Conjunctiva/sclera: Conjunctivae normal.     Pupils: Pupils are equal, round, and reactive to light.  Cardiovascular:     Rate and Rhythm: Normal rate and regular rhythm.     Heart sounds: No murmur heard. Pulmonary:     Effort: Pulmonary effort is normal. No respiratory distress.      Breath sounds: Normal breath sounds.  Abdominal:     General: Abdomen is flat. There is no distension.     Palpations: Abdomen is soft.     Tenderness: There is no abdominal tenderness. There is no right CVA tenderness or left CVA tenderness.  Musculoskeletal:        General: No swelling, tenderness, deformity or signs of injury. Normal range of motion.     Cervical back: Normal range of motion and neck supple. No rigidity.  Skin:    General: Skin is warm and dry.  Neurological:     General: No focal deficit present.     Mental Status: She is alert and oriented to person, place, and time. Mental status is at baseline.     Cranial Nerves: No cranial nerve deficit.  Psychiatric:        Mood and Affect: Mood normal.      ED Course/ Medical Decision Making/ A&P    Procedures .Critical Care  Performed by: Jerral Meth, MD Authorized by: Jerral Meth, MD   Critical care provider statement:    Critical care time (minutes):  60   Critical care was necessary to treat or prevent imminent or life-threatening deterioration of the following conditions:  Circulatory failure   Critical care was time spent personally by me on the following activities:  Development of treatment plan with patient or surrogate, discussions with consultants, evaluation  of patient's response to treatment, examination of patient, ordering and review of laboratory studies, ordering and review of radiographic studies, ordering and performing treatments and interventions, pulse oximetry, re-evaluation of patient's condition and review of old charts   Care discussed with: admitting provider      Medications Ordered in ED Medications  fentaNYL (SUBLIMAZE) injection 50 mcg (has no administration in time range)  ondansetron  (ZOFRAN -ODT) disintegrating tablet 4 mg (4 mg Oral Given 04/05/24 2326)  0.9 %  sodium chloride  infusion (Manually program via Guardrails IV Fluids) ( Intravenous New Bag/Given 04/06/24 0057)   ondansetron  (ZOFRAN ) injection 4 mg (4 mg Intravenous Given 04/06/24 0259)  lactated ringers bolus 1,000 mL (1,000 mLs Intravenous New Bag/Given 04/06/24 0259)  iohexol  (OMNIPAQUE ) 350 MG/ML injection 75 mL (75 mLs Intravenous Contrast Given 04/06/24 0235)   Medical Decision Making:   Maureen Hensley is a 38 y.o. female who presented to the ED today with abdominal pain, detailed above.    Patient placed on continuous vitals and telemetry monitoring while in ED which was reviewed periodically.  Complete initial physical exam performed, notably the patient  was hemodynamically stable no acute distress.     Reviewed and confirmed nursing documentation for past medical history, family history, social history.    Initial Assessment:   With the patient's presentation of abdominal pain, most likely diagnosis is colitis. Other diagnoses were considered including (but not limited to) gastroenteritis, colitis, small bowel obstruction, appendicitis, cholecystitis, pancreatitis, nephrolithiasis, UTI, pyleonephritis, ruptured ectopic pregnancy, PID, torsion. These are considered less likely due to history of present illness and physical exam findings.   This is most consistent with an acute life/limb threatening illness complicated by underlying chronic conditions.   Initial Plan:  CBC/CMP to evaluate for underlying infectious/metabolic etiology for patient's abdominal pain  Lipase to evaluate for pancreatitis  EKG to evaluate for cardiac source of pain  CTAB/Pelvis with contrast to evaluate for structural/surgical etiology of patients' severe abdominal pain.  Urinalysis and repeat physical assessment to evaluate for UTI/Pyelonpehritis  Empiric management of symptoms with escalating pain control and antiemetics as needed.   Initial Study Results:   Laboratory  Hemoglobin of 5.0   Radiology All images reviewed independently. Agree with radiology report at this time.   CT ANGIO ABDOMEN PELVIS  W & WO  CONTRAST Result Date: 04/06/2024 EXAM: CTA ABDOMEN AND PELVIS WITHOUT AND WITH CONTRAST 04/06/2024 02:34:47 AM TECHNIQUE: CTA images of the abdomen and pelvis without and with intravenous contrast. 75 mL of iohexol  (OMNIPAQUE ) 350 MG/ML injection was administered. Three-dimensional MIP/volume rendered formations were performed. Automated exposure control, iterative reconstruction, and/or weight based adjustment of the mA/kV was utilized to reduce the radiation dose to as low as reasonably achievable. COMPARISON: 07/11/2023 CLINICAL HISTORY: Lower GI bleed FINDINGS: VASCULATURE: No acute finding. AORTA: No acute finding. No abdominal aortic aneurysm. No dissection. CELIAC TRUNK: No acute finding. No occlusion or significant stenosis. SUPERIOR MESENTERIC ARTERY: No acute finding. No occlusion or significant stenosis. RENAL ARTERIES: No acute finding. No occlusion or significant stenosis. ILIAC ARTERIES: No acute finding. No occlusion or significant stenosis. LIVER: The liver is unremarkable. GALLBLADDER AND BILE DUCTS: Gallbladder is unremarkable. No biliary ductal dilatation. SPLEEN: The spleen is unremarkable. PANCREAS: The pancreas is unremarkable. ADRENAL GLANDS: Bilateral adrenal glands demonstrate no acute abnormality. KIDNEYS, URETERS AND BLADDER: No stones in the kidneys or ureters. No hydronephrosis. No perinephric or periureteral stranding. Urinary bladder is unremarkable. GI AND BOWEL: Stomach and duodenal sweep demonstrate no acute abnormality. Normal appendix.  No evidence of acute GI bleeding. Mild colonic wall thickening of the descending colon. Trace adjacent stranding about the colon at the splenic flexure. The sigmoid colon is decompressed. There is no bowel obstruction. No abnormal bowel wall thickening or distension. REPRODUCTIVE: Reproductive organs are unremarkable. PERITONEUM AND RETROPERITONEUM: Small volume low density free fluid in the pelvis. No ascites or free air. LUNG BASE: No acute  abnormality. LYMPH NODES: No lymphadenopathy. BONES AND SOFT TISSUES: Fat containing periumbilical hernia. No acute abnormality of the bones. IMPRESSION: 1. Mild descending colitis. No evidence of acute GI bleeding. Electronically signed by: Norman Gatlin MD 04/06/2024 02:57 AM EST RP Workstation: [id number]     Consults: Case discussed with overnight gastroenterologist.  Messaged via secure chat per standardize local practice.  Patient does not require immediate consultation but a.m. consultation  Final Reassessment and Plan:   Uncertain underlying etiology for patient's lower GI bleeding.  Patient clinically stable.  She consented for transfusion which was administered here in the emergency room. Possibly infectious colitis though atypical presentation.  May be AVM causing patient's presentation. Stabilizing overnight admitting to medicine with a.m. GI evaluation.  Disposition:   Based on the above findings, I believe this patient is stable for admission.    Patient/family educated about specific findings on our evaluation and explained exact reasons for admission.  Patient/family educated about clinical situation and time was allowed to answer questions.   Admission team communicated with and agreed with need for admission. Patient admitted. Patient  ready to move at this time.     Emergency Department Medication Summary:   Medications  fentaNYL (SUBLIMAZE) injection 50 mcg (has no administration in time range)  ondansetron  (ZOFRAN -ODT) disintegrating tablet 4 mg (4 mg Oral Given 04/05/24 2326)  0.9 %  sodium chloride  infusion (Manually program via Guardrails IV Fluids) ( Intravenous New Bag/Given 04/06/24 0057)  ondansetron  (ZOFRAN ) injection 4 mg (4 mg Intravenous Given 04/06/24 0259)  lactated ringers bolus 1,000 mL (1,000 mLs Intravenous New Bag/Given 04/06/24 0259)  iohexol  (OMNIPAQUE ) 350 MG/ML injection 75 mL (75 mLs Intravenous Contrast Given 04/06/24 0235)    Clinical Impression:   1. Lower GI bleeding      Data Unavailable   Final Clinical Impression(s) / ED Diagnoses Final diagnoses:  Lower GI bleeding    Rx / DC Orders ED Discharge Orders     None         Jerral Meth, MD 04/06/24 [phone]

## 2024-04-06 ENCOUNTER — Encounter (HOSPITAL_COMMUNITY): Payer: Self-pay | Admitting: Family Medicine

## 2024-04-06 ENCOUNTER — Emergency Department (HOSPITAL_COMMUNITY)

## 2024-04-06 DIAGNOSIS — N920 Excessive and frequent menstruation with regular cycle: Secondary | ICD-10-CM

## 2024-04-06 DIAGNOSIS — D62 Acute posthemorrhagic anemia: Secondary | ICD-10-CM | POA: Diagnosis not present

## 2024-04-06 DIAGNOSIS — D75839 Thrombocytosis, unspecified: Secondary | ICD-10-CM

## 2024-04-06 DIAGNOSIS — K625 Hemorrhage of anus and rectum: Secondary | ICD-10-CM | POA: Diagnosis not present

## 2024-04-06 DIAGNOSIS — K922 Gastrointestinal hemorrhage, unspecified: Principal | ICD-10-CM

## 2024-04-06 DIAGNOSIS — R1010 Upper abdominal pain, unspecified: Secondary | ICD-10-CM | POA: Diagnosis not present

## 2024-04-06 DIAGNOSIS — D649 Anemia, unspecified: Secondary | ICD-10-CM | POA: Diagnosis present

## 2024-04-06 LAB — IRON AND TIBC
Iron: 19 ug/dL — ABNORMAL LOW (ref 28–170)
Saturation Ratios: 4 % — ABNORMAL LOW (ref 10.4–31.8)
TIBC: 479 ug/dL — ABNORMAL HIGH (ref 250–450)
UIBC: 460 ug/dL

## 2024-04-06 LAB — CBC
HCT: 27.8 % — ABNORMAL LOW (ref 36.0–46.0)
HCT: 27.8 % — ABNORMAL LOW (ref 36.0–46.0)
Hemoglobin: 7.8 g/dL — ABNORMAL LOW (ref 12.0–15.0)
Hemoglobin: 7.9 g/dL — ABNORMAL LOW (ref 12.0–15.0)
MCH: 18.7 pg — ABNORMAL LOW (ref 26.0–34.0)
MCH: 18.5 pg — ABNORMAL LOW (ref 26.0–34.0)
MCHC: 28.1 g/dL — ABNORMAL LOW (ref 30.0–36.0)
MCHC: 28.4 g/dL — ABNORMAL LOW (ref 30.0–36.0)
MCV: 66.5 fL — ABNORMAL LOW (ref 80.0–100.0)
MCV: 65.1 fL — ABNORMAL LOW (ref 80.0–100.0)
Platelets: 538 K/uL — ABNORMAL HIGH (ref 150–400)
Platelets: 530 K/uL — ABNORMAL HIGH (ref 150–400)
RBC: 4.18 MIL/uL (ref 3.87–5.11)
RBC: 4.27 MIL/uL (ref 3.87–5.11)
RDW: 32.5 % — ABNORMAL HIGH (ref 11.5–15.5)
RDW: 31.7 % — ABNORMAL HIGH (ref 11.5–15.5)
WBC: 8.1 K/uL (ref 4.0–10.5)
WBC: 7.3 K/uL (ref 4.0–10.5)
nRBC: 0 % (ref 0.0–0.2)
nRBC: 0 % (ref 0.0–0.2)

## 2024-04-06 LAB — FOLATE: Folate: 10.1 ng/mL (ref >5.9)

## 2024-04-06 LAB — FERRITIN: Ferritin: 1 ng/mL — ABNORMAL LOW (ref 11–307)

## 2024-04-06 LAB — RETICULOCYTES
Immature Retic Fract: 31.2 % — ABNORMAL HIGH (ref 2.3–15.9)
RBC.: 4 MIL/uL (ref 3.87–5.11)
Retic Count, Absolute: 25 K/uL (ref 19.0–186.0)
Retic Ct Pct: 0.6 % (ref 0.4–3.1)

## 2024-04-06 LAB — PREPARE RBC (CROSSMATCH)

## 2024-04-06 LAB — VITAMIN B12: Vitamin B-12: <150 pg/mL — ABNORMAL LOW (ref 180–914)

## 2024-04-06 MED ORDER — CYANOCOBALAMIN 1000 MCG/ML IJ SOLN
1000.0000 ug | Freq: Every day | INTRAMUSCULAR | Status: DC
  Administered 2024-04-06: 1000 ug via INTRAMUSCULAR
  Filled 2024-04-06 (×2): qty 1

## 2024-04-06 MED ORDER — SIMETHICONE 80 MG PO CHEW
240.0000 mg | CHEWABLE_TABLET | Freq: Once | ORAL | Status: AC
  Administered 2024-04-06: 240 mg via ORAL
  Filled 2024-04-06: qty 3

## 2024-04-06 MED ORDER — SODIUM CHLORIDE 0.9% FLUSH
3.0000 mL | Freq: Two times a day (BID) | INTRAVENOUS | Status: DC
  Administered 2024-04-06: 3 mL via INTRAVENOUS

## 2024-04-06 MED ORDER — BISACODYL 5 MG PO TBEC
10.0000 mg | DELAYED_RELEASE_TABLET | Freq: Once | ORAL | Status: AC
  Administered 2024-04-06: 10 mg via ORAL
  Filled 2024-04-06: qty 2

## 2024-04-06 MED ORDER — NA SULFATE-K SULFATE-MG SULF 17.5-3.13-1.6 GM/177ML PO SOLN
0.5000 | Freq: Once | ORAL | Status: AC
  Administered 2024-04-06: 177 mL via ORAL

## 2024-04-06 MED ORDER — LACTATED RINGERS IV SOLN: INTRAVENOUS | Status: AC

## 2024-04-06 MED ORDER — IRON SUCROSE 200 MG IVPB - SIMPLE MED
200.0000 mg | Freq: Once | Status: DC
  Filled 2024-04-06: qty 110

## 2024-04-06 MED ORDER — SODIUM CHLORIDE 0.9 % IV SOLN
200.0000 mg | Freq: Once | INTRAVENOUS | Status: DC
  Filled 2024-04-06: qty 10

## 2024-04-06 MED ORDER — SODIUM CHLORIDE 0.9 % IV SOLN: INTRAVENOUS | Status: AC

## 2024-04-06 MED ORDER — ACETAMINOPHEN 650 MG RE SUPP: 650.0000 mg | Freq: Four times a day (QID) | RECTAL | Status: DC | PRN

## 2024-04-06 MED ORDER — PROCHLORPERAZINE EDISYLATE 10 MG/2ML IJ SOLN: 5.0000 mg | Freq: Four times a day (QID) | INTRAMUSCULAR | Status: DC | PRN

## 2024-04-06 MED ORDER — LACTATED RINGERS IV BOLUS
1000.0000 mL | Freq: Once | INTRAVENOUS | Status: AC
  Administered 2024-04-06: 1000 mL via INTRAVENOUS

## 2024-04-06 MED ORDER — SIMETHICONE 80 MG PO CHEW: 240.0000 mg | CHEWABLE_TABLET | Freq: Once | ORAL | Status: DC

## 2024-04-06 MED ORDER — IOHEXOL 350 MG/ML SOLN
75.0000 mL | Freq: Once | INTRAVENOUS | Status: AC | PRN
  Administered 2024-04-06: 75 mL via INTRAVENOUS

## 2024-04-06 MED ORDER — ACETAMINOPHEN 325 MG PO TABS: 650.0000 mg | ORAL_TABLET | Freq: Four times a day (QID) | ORAL | Status: DC | PRN

## 2024-04-06 MED ORDER — ONDANSETRON HCL 4 MG/2ML IJ SOLN
4.0000 mg | Freq: Once | INTRAMUSCULAR | Status: AC
  Administered 2024-04-06: 4 mg via INTRAVENOUS
  Filled 2024-04-06: qty 2

## 2024-04-06 MED ORDER — FENTANYL CITRATE (PF) 50 MCG/ML IJ SOSY: 50.0000 ug | PREFILLED_SYRINGE | INTRAMUSCULAR | Status: DC | PRN

## 2024-04-06 MED ORDER — VITAMIN B-12 100 MCG PO TABS: 100.0000 ug | ORAL_TABLET | Freq: Every day | ORAL | Status: DC

## 2024-04-06 MED ORDER — SODIUM CHLORIDE 0.9% IV SOLUTION: Freq: Once | INTRAVENOUS | Status: AC

## 2024-04-06 MED ORDER — OXYCODONE HCL 5 MG PO TABS: 5.0000 mg | ORAL_TABLET | ORAL | Status: DC | PRN

## 2024-04-06 MED ORDER — NA SULFATE-K SULFATE-MG SULF 17.5-3.13-1.6 GM/177ML PO SOLN
0.5000 | Freq: Once | ORAL | Status: AC
  Administered 2024-04-07: 177 mL via ORAL
  Filled 2024-04-06: qty 1

## 2024-04-06 NOTE — H&P (View-Only) (Signed)
 Consultation  Referring Provider: [facility]/Opyd Primary Care Physician:  Patient, No Pcp Per Primary Gastroenterologist:  unassigned  Reason for Consultation: Bright red blood per rectum, abdominal pain and cramping  HPI: Maureen Hensley is a 38 y.o. female with history of menorrhagia, who was actually hospitalized in March 2025 with symptomatic anemia and musculoskeletal chest pain.  Hemoglobin 5.7 at that time and was transfused 2 units of packed RBCs.  Per the notes, OB/GYN was contacted/Dr. Fredirick and patient was to be seen as an outpatient within a week. She was discharged on oral iron supplementation.  Patient states today that she ran out of her iron supplement a while back.  She says she was also never given an appointment to see OB/GYN and had tried to call around but was never able to obtain an appointment. She says that she has had very heavy periods over the past 8 or 9 years.  She says usually her menses last for about 7 days and that her bleeding is heavy over the first 6 days usually having to change a pad every 1-2 hours.  She presented to the emergency room yesterday with a new symptom of bright red blood per rectum.  She said she noticed this initially with a bowel movement on Sunday evening with blood noted in the stool and on the tissue.  Then yesterday she had significant abdominal cramping/pain and passed stool mixed with blood twice at home and then a couple of times after arrival to the emergency room.  She also had associated nausea and vomiting, no syncope, no diaphoresis.  She had not on any new medications, or done anything differently prior to this acute episode. She feels better today has not had any further bowel movements or bleeding.  Workup in the ER with CT angiography shows no evidence of active bleeding, there is some mild colonic wall thickening in the left colon and trace stranding at the splenic flexure. Labs on 04/05/2024 with WBC 6.2/hemoglobin 5/hematocrit  20.2/MCV 59/platelets 639 Potassium 3.5/BUN 10/creatinine 0.68 LFTs within normal limits Today hemoglobin 7.8/hematocrit 27.8 post 2 units of packed RBCs. Ferritin 1/serum iron 19/TIBC 479/iron sat 4/B12 low at less than 150 GI path panel pending  Reviewing previous labs as noted above hemoglobin is low as 5.7 in March 2025 at the time of discharge hemoglobin 10.2.  [place] she also had a CT in March 2025 when she was complaining of epigastric pain and was noted to have other antral wall thickening suggesting possible gastritis or ulcer disease.  Family history is negative for GI disease as far she is aware.  No regular aspirin or NSAIDs.   Past Medical History:  Diagnosis Date   Anemia     Past Surgical History:  Procedure Laterality Date   CESAREAN SECTION     3   ECTOPIC PREGNANCY SURGERY      Prior to Admission medications   Medication Sig Start Date End Date Taking? Authorizing Provider  ferrous sulfate  325 (65 FE) MG tablet Take 1 tablet (325 mg total) by mouth 2 (two) times daily with a meal. 07/06/23   Dennise Lavada POUR, MD  folic acid  (FOLVITE ) 1 MG tablet Take 1 tablet (1 mg total) by mouth daily. 07/07/23   Singh, Prashant K, MD  naproxen  (NAPROSYN ) 500 MG tablet Take 1 tablet (500 mg total) by mouth every 12 (twelve) hours as needed for moderate pain (pain score 4-6) or mild pain (pain score 1-3). 07/01/23   Amyot, Jenkins Lesches,  NP  ondansetron  (ZOFRAN -ODT) 4 MG disintegrating tablet Take 1 tablet (4 mg total) by mouth every 8 (eight) hours as needed for nausea or vomiting. 07/10/23   Robinson, John K, PA-C  pantoprazole  (PROTONIX ) 20 MG tablet Take 1 tablet (20 mg total) by mouth daily. 07/10/23 08/09/23  Robinson, John K, PA-C  promethazine  (PHENERGAN ) 25 MG tablet Take 1 tablet (25 mg total) by mouth every 6 (six) hours as needed for nausea or vomiting. 07/11/23   Henderly, Britni A, PA-C  sucralfate  (CARAFATE ) 1 g tablet Take 1 tablet (1 g total) by mouth 4 (four) times daily -   with meals and at bedtime for 14 days. 07/11/23 07/25/23  Henderly, Britni A, PA-C  sulfamethoxazole -trimethoprim  (BACTRIM  DS) 800-160 MG tablet Take 1 tablet by mouth 2 (two) times daily. 07/06/23   Singh, Prashant K, MD    Current Facility-Administered Medications  Medication Dose Route Frequency Provider Last Rate Last Admin   acetaminophen  (TYLENOL ) tablet 650 mg  650 mg Oral Q6H PRN Opyd, Timothy S, MD       Or   acetaminophen  (TYLENOL ) suppository 650 mg  650 mg Rectal Q6H PRN Opyd, Timothy S, MD       cyanocobalamin  (VITAMIN B12) injection 1,000 mcg  1,000 mcg Intramuscular Daily Dahal, Binaya, MD       Followed by   NOREEN ON 04/13/2024] vitamin B-12 (CYANOCOBALAMIN ) tablet 100 mcg  100 mcg Oral Daily Dahal, Binaya, MD       fentaNYL (SUBLIMAZE) injection 50 mcg  50 mcg Intravenous Q2H PRN Opyd, Timothy S, MD       iron sucrose (VENOFER) 200 mg in sodium chloride  0.9 % 100 mL IVPB  200 mg Intravenous Once Dahal, Binaya, MD       lactated ringers infusion   Intravenous Continuous Opyd, Timothy S, MD 75 mL/hr at 04/06/24 0618 New Bag at 04/06/24 0618   oxyCODONE  (Oxy IR/ROXICODONE ) immediate release tablet 5 mg  5 mg Oral Q4H PRN Opyd, Timothy S, MD       prochlorperazine (COMPAZINE) injection 5 mg  5 mg Intravenous Q6H PRN Opyd, Timothy S, MD       sodium chloride  flush (NS) 0.9 % injection 3 mL  3 mL Intravenous Q12H Opyd, Timothy S, MD       Current Outpatient Medications  Medication Sig Dispense Refill   ferrous sulfate  325 (65 FE) MG tablet Take 1 tablet (325 mg total) by mouth 2 (two) times daily with a meal. 60 tablet 0   folic acid  (FOLVITE ) 1 MG tablet Take 1 tablet (1 mg total) by mouth daily. 30 tablet 0   naproxen  (NAPROSYN ) 500 MG tablet Take 1 tablet (500 mg total) by mouth every 12 (twelve) hours as needed for moderate pain (pain score 4-6) or mild pain (pain score 1-3). 20 tablet 0   ondansetron  (ZOFRAN -ODT) 4 MG disintegrating tablet Take 1 tablet (4 mg total) by mouth every  8 (eight) hours as needed for nausea or vomiting. 20 tablet 0   pantoprazole  (PROTONIX ) 20 MG tablet Take 1 tablet (20 mg total) by mouth daily. 30 tablet 0   promethazine  (PHENERGAN ) 25 MG tablet Take 1 tablet (25 mg total) by mouth every 6 (six) hours as needed for nausea or vomiting. 15 tablet 0   sucralfate  (CARAFATE ) 1 g tablet Take 1 tablet (1 g total) by mouth 4 (four) times daily -  with meals and at bedtime for 14 days. 56 tablet 0   sulfamethoxazole -trimethoprim  (BACTRIM  DS)  800-160 MG tablet Take 1 tablet by mouth 2 (two) times daily. 14 tablet 0    Allergies as of 04/05/2024   (No Known Allergies)    History reviewed. No pertinent family history.  Social History   Socioeconomic History   Marital status: Single    Spouse name: Not on file   Number of children: Not on file   Years of education: Not on file   Highest education level: Not on file  Occupational History   Not on file  Tobacco Use   Smoking status: Former    Types: Cigarettes   Smokeless tobacco: Never  Vaping Use   Vaping status: Never Used  Substance and Sexual Activity   Alcohol use: Not Currently   Drug use: Not Currently   Sexual activity: Yes    Birth control/protection: None  Other Topics Concern   Not on file  Social History Narrative   Not on file   Social Drivers of Health   Financial Resource Strain: Not on file  Food Insecurity: No Food Insecurity (07/05/2023)   Hunger Vital Sign    Worried About Running Out of Food in the Last Year: Never true    Ran Out of Food in the Last Year: Never true  Transportation Needs: No Transportation Needs (07/05/2023)   PRAPARE - Administrator, Civil Service (Medical): No    Lack of Transportation (Non-Medical): No  Physical Activity: Not on file  Stress: Not on file  Social Connections: Moderately Isolated (07/05/2023)   Social Connection and Isolation Panel    Frequency of Communication with Friends and Family: More than three times a week     Frequency of Social Gatherings with Friends and Family: More than three times a week    Attends Religious Services: Never    Database Administrator or Organizations: Yes    Attends Banker Meetings: Never    Marital Status: Separated  Intimate Partner Violence: Not At Risk (07/05/2023)   Humiliation, Afraid, Rape, and Kick questionnaire    Fear of Current or Ex-Partner: No    Emotionally Abused: No    Physically Abused: No    Sexually Abused: No    Review of Systems: Pertinent positive and negative review of systems were noted in the above HPI section.  All other review of systems was otherwise negative.  Physical Exam: Vital signs in last 24 hours: Temp:  [97.8 F (36.6 C)-98.8 F (37.1 C)] 98.3 F (36.8 C) (12/03 0936) Pulse Rate:  [61-92] 61 (12/03 1045) Resp:  [14-23] 23 (12/03 1045) BP: (99-119)/(49-87) 119/73 (12/03 1045) SpO2:  [99 %-100 %] 100 % (12/03 1045)   General:   Alert,  Well-developed, well-nourished, young female pleasant and cooperative in NAD Head:  Normocephalic and atraumatic. Eyes:  Sclera clear, no icterus.   Conjunctiva pale Ears:  Normal auditory acuity. Nose:  No deformity, discharge,  or lesions. Mouth:  No deformity or lesions.   Neck:  Supple; no masses or thyromegaly. Lungs:  Clear throughout to auscultation.   No wheezes, crackles, or rhonchi . Heart:  Regular rate and rhythm; no murmurs, clicks, rubs,  or gallops. Abdomen:  Soft,nontender, BS active,nonpalp mass or hsm.   Rectal: Not done Msk:  Symmetrical without gross deformities. . Pulses:  Normal pulses noted. Extremities:  Without clubbing or edema. Neurologic:  Alert and  oriented x4;  grossly normal neurologically. Skin:  Intact without significant lesions or rashes.. Psych:  Alert and cooperative. Normal mood and  affect.  Intake/Output from previous day: No intake/output data recorded. Intake/Output this shift: Total I/O In: 71.3 [I.V.:71.3] Out: -   Lab  Results: Recent Labs    04/05/24 2248 04/06/24 0824  WBC 6.2 8.1  HGB 5.0* 7.8*  HCT 20.2* 27.8*  PLT 639* 538*   BMET Recent Labs    04/05/24 2248  NA 136  K 3.5  CL 104  CO2 24  GLUCOSE 111*  BUN 11  CREATININE 0.68  CALCIUM 8.2*   LFT Recent Labs    04/05/24 2248  PROT 6.0*  ALBUMIN 3.6  AST 16  ALT 11  ALKPHOS 37*  BILITOT 0.5   PT/INR No results for input(s): LABPROT, INR in the last 72 hours. Hepatitis Panel No results for input(s): HEPBSAG, HCVAB, HEPAIGM, HEPBIGM in the last 72 hours.   IMPRESSION:  #78 38 year old female with history of chronic iron deficiency anemia felt on the basis of menorrhagia.  She does not have a formal diagnosis of endometriosis though that is listed in her chart. Hospitalized March 2025 and required transfusions.  She was to have GYN evaluation but that has never occurred.  She presents to the ER now after an acute episode of abdominal pain cramping followed by bloody stools.  She had also noted blood in her stool on "Sunday evening prior to the cramping starting.  Hemoglobin 5 on admission, severely iron deficient CT imaging raises a question of some mild colonic wall thickening of the left colon and some stranding at the splenic flexure.  I think her severe chronic iron deficiency is primarily being driven by the menorrhagia.  Query if this acute episode was a mild segmental ischemic colitis which she is already symptomatically.  However given the severity and recurrence of severe anemia/iron deficiency further GI endoscopic workup is indicated to rule out other etiologies for her severe iron deficiency anemia.  #2 B12 deficiency-new diagnosis   PLAN: Patient received IV iron infusion today has received 2 units of packed RBCs Continue to trend hemoglobin and transfuse as indicated Liquid diet today, n.p.o. after midnight Discussed endoscopic evaluation with the patient to include colonoscopy and EGD.  We  discussed indications risks and benefits and she is agreeable to proceed.  Procedures will be scheduled with Dr. Ashima Shrake for tomorrow 04/07/2024, with bowel prep this evening.  GYN needs to see patient during this admission or she needs a definite appointment scheduled prior to discharge.  GI will follow with you   Amy Esterwood PA-C 04/06/2024, 11:38 AM   Attending physician's note  I personally saw the patient and performed a substantive portion of the medical decision making process for this encounter (including a complete performance of the key components : MDM, Hx and Exam), in conjunction with the APP.  I agree with the APP's note, impression, and  the management plan for the number and complexity of problems addressed at the encounter for the patient and take responsibility for that plan with its inherent risk of complications, morbidity, or mortality with additional input as follows.     38"  year old female with history of severe symptomatic anemia, abdominal pain and cramping Severe iron deficiency   Will plan to proceed with EGD and colonoscopy for further evaluation of severe iron deficiency Monitor hemoglobin and transfuse if below 7 IV iron infusion Bowel prep Clear liquid diet  The risks and benefits as well as alternatives of endoscopic procedure(s) have been discussed and reviewed. All questions answered. The patient agrees to proceed.   The  patient was provided an opportunity to ask questions and all were answered. The patient agreed with the plan and demonstrated an understanding of the instructions.  LOIS Wilkie Mcgee , MD [phone]

## 2024-04-06 NOTE — Progress Notes (Signed)
 PROGRESS NOTE  Maureen Hensley  DOB: [birth year]  PCP: Patient, No Pcp Per FMW:[id number]  DOA: 04/05/2024  LOS: 0 days  Hospital Day: 2  Subjective: Patient was seen and examined this morning.  Pleasant young female.  Lying on bed.  Not in distress.  Looks pale Family not at bedside. Afebrile, hemodynamically stable  Brief narrative: Maureen Hensley is a 38 y.o. female with PMH significant for chronic menorrhagia, chronic iron deficiency. 12/2, patient presented to the ED with complaint of lower abdominal pain, BRBPR for 3 to 4 days.  In the ED, patient also had nausea, nonbloody vomiting and several episodes of bloody diarrhea. Labs showed WC count 6.2, hemoglobin 5, MCV 59 CT angio abdomen showed mild descending colitis without evidence of active GI bleeding. 2 units of PRBC transfusion ordered [facility] consulted Admitted to [facility]  Assessment and plan: Acute lower GI bleeding  Presented with BRBPR.  No prior history of GI bleeding In the ED, had witnessed nonbloody vomiting and bloody diarrhea With low hemoglobin [facility] has been consulted  Acute on chronic blood loss anemia  Chronic severe iron deficiency anemia Chronic vitamin B12 deficiency Patient has mild chronic anemia with severe iron deficiency secondary to chronic menorrhagia  And anemia panel as below.  Ferritin level was just 1 in March 2025.  Apparently noncompliant to iron supplement lately. 2 units PRBC transfusion given Anemia panel repeated today shows significantly low ferritin at 1, low vitamin B12 at 150. I have ordered for IV Venofer as well as IM vitamin B12. Recent Labs    07/04/23 2307 07/05/23 0320 07/06/23 0546 07/09/23 1958 07/11/23 1425 04/05/24 2248 04/06/24 0824 04/06/24 0830  HGB  --    < > 8.3* 9.5* 10.2* 5.0* 7.8*  --   MCV  --    < > 64.8* 65.6* 66.7* 59.1* 66.5*  --   VITAMINB12 129*  --   --   --   --   --  <150*  --   FOLATE 12.8  --   --   --   --   --   --  10.1  FERRITIN  1*  --   --   --   --   --  1*  --   TIBC 494*  --   --   --   --   --  479*  --   IRON 8*  --   --   --   --   --  19*  --   RETICCTPCT 1.3  --   --   --   --   --   --  0.6   < > = values in this interval not displayed.   Chronic menorrhagia Currently not having vaginal bleeding Needs to follow-up with OB/GYN  Chronic thrombocytosis  Likely reactive in setting of IDA   Nutrition Status:         Mobility: Encourage ambulation  PT Orders:   PT Follow up Rec:     Goals of care   Code Status: Full Code     DVT prophylaxis:  SCDs Start: 04/06/24 0511   Antimicrobials: None Fluid: Currently on LR at 75 mL/h Consultants: GI Family Communication: None at bedside  Status: Observation Level of care: Progressive currently.  Can downgrade to Telemetry   Patient is from: Home Needs to continue in-hospital care: Pending GI eval Anticipated d/c to: Pending clinical course      Diet:  Diet Order  Diet NPO time specified Except for: Sips with Meds, Ice Chips  Diet effective now                   Scheduled Meds:  sodium chloride  flush  3 mL Intravenous Q12H    PRN meds: acetaminophen  **OR** acetaminophen , fentaNYL (SUBLIMAZE) injection, oxyCODONE , prochlorperazine   Infusions:   lactated ringers 75 mL/hr at 04/06/24 0618    Antimicrobials: Anti-infectives (From admission, onward)    None       Objective: Vitals:   04/06/24 0445 04/06/24 0936  BP: 112/75 114/76  Pulse: 68 63  Resp: 16 14  Temp: 98.3 F (36.8 C) 98.3 F (36.8 C)  SpO2: 100% 100%    Intake/Output Summary (Last 24 hours) at 04/06/2024 1034 Last data filed at 04/06/2024 0805 Gross per 24 hour  Intake 71.33 ml  Output --  Net 71.33 ml   There were no vitals filed for this visit. Weight change:  There is no height or weight on file to calculate BMI.   Physical Exam: General exam: Pleasant, young female.  Not in distress Skin: No rashes, lesions or  ulcers. HEENT: Atraumatic, normocephalic, no obvious bleeding Lungs: Clear to auscultation bilaterally,  CVS: S1, S2, no murmur,   GI/Abd: Soft, nontender, nondistended, bowel sound present,   CNS: Alert, awake, oriented x 3 Psychiatry: Mood appropriate Extremities: No pedal edema, no calf tenderness,   Data Review: I have personally reviewed the laboratory data and studies available.  F/u labs ordered Unresulted Labs (From admission, onward)     Start     Ordered   04/07/24 0500  Basic metabolic panel  Daily,   R      04/06/24 0513   04/06/24 0800  CBC  Now then every 8 hours,   R      04/06/24 0513   04/06/24 0513  Gastrointestinal Panel by PCR , Stool  (Gastrointestinal Panel by PCR, Stool                                                                                                                                                     **Does Not include CLOSTRIDIUM DIFFICILE testing. **If CDIFF testing is needed, place order from the C Difficile Testing order set.**)  Once,   R        04/06/24 0513            Signed, Chapman Rota, MD [facility] 04/06/2024

## 2024-04-06 NOTE — ED Notes (Addendum)
 This RN just assumed care of this PT, Per last RN report PT's meds were not given because pharmacy has not sent them

## 2024-04-06 NOTE — H&P (Signed)
 History and Physical    Maureen Hensley FMW:[id number] DOB: 06/28/1985 DOA: 04/05/2024  PCP: Patient, No Pcp Per   Patient coming from: Home   Chief Complaint: Rectal bleeding, abdominal pain, nausea, vomiting, diarrhea  HPI: Maureen Hensley is a 38 y.o. female with medical history significant for heavy menstrual bleeding and iron deficiency anemia who presents with lower abdominal pain and bright red blood per rectum.  Patient reports that she has had bright red blood per rectum for 3 to 4 days, developed severe lower abdominal pain, and then had nausea, vomiting, and diarrhea after arriving in the emergency department tonight.  Patient states that she had never experienced rectal bleeding before this.  Initially, there was just frank blood with no stool or clots.  In the emergency department tonight, she had nausea with nonbloody vomiting and multiple episodes of diarrhea with bright red blood.  She denies any fevers, chills, recent travel, or sick contacts.  She has been going to work and performing her usual activities and has not noticed any lightheadedness or exertional dyspnea.    She was previously taking iron supplements but not recently.  She does not take any prescription medications at this time.  ED Course: Upon arrival to the ED, patient is found to be afebrile and saturating well on room air with normal HR and SBP 99 and greater.  Labs are most notable for normal BUN, normal creatinine, normal WBC, hemoglobin 5.0, platelets 639,000, and MCV 59.1.  CTA reveals mild wall thickening of the descending colon without evidence for active GI bleeding.  ED physician sent a message to [facility] with request for consultation and the patient was given 1 L LR, Zofran , and started on a 2-unit RBC transfusion.  Review of Systems:  All other systems reviewed and apart from HPI, are negative.  Past Medical History:  Diagnosis Date   Anemia     Past Surgical History:  Procedure Laterality  Date   CESAREAN SECTION     3   ECTOPIC PREGNANCY SURGERY      Social History:   reports that she has quit smoking. Her smoking use included cigarettes. She has never used smokeless tobacco. She reports that she does not currently use alcohol. She reports that she does not currently use drugs.  No Known Allergies  History reviewed. No pertinent family history.   Prior to Admission medications   Medication Sig Start Date End Date Taking? Authorizing Provider  ferrous sulfate  325 (65 FE) MG tablet Take 1 tablet (325 mg total) by mouth 2 (two) times daily with a meal. 07/06/23   Dennise Lavada POUR, MD  folic acid  (FOLVITE ) 1 MG tablet Take 1 tablet (1 mg total) by mouth daily. 07/07/23   Singh, Prashant K, MD  naproxen  (NAPROSYN ) 500 MG tablet Take 1 tablet (500 mg total) by mouth every 12 (twelve) hours as needed for moderate pain (pain score 4-6) or mild pain (pain score 1-3). 07/01/23   Amyot, Jenkins Lesches, NP  ondansetron  (ZOFRAN -ODT) 4 MG disintegrating tablet Take 1 tablet (4 mg total) by mouth every 8 (eight) hours as needed for nausea or vomiting. 07/10/23   Robinson, John K, PA-C  pantoprazole  (PROTONIX ) 20 MG tablet Take 1 tablet (20 mg total) by mouth daily. 07/10/23 08/09/23  Robinson, John K, PA-C  promethazine  (PHENERGAN ) 25 MG tablet Take 1 tablet (25 mg total) by mouth every 6 (six) hours as needed for nausea or vomiting. 07/11/23   Henderly, Britni A, PA-C  sucralfate  (CARAFATE ) 1 g tablet Take 1 tablet (1 g total) by mouth 4 (four) times daily -  with meals and at bedtime for 14 days. 07/11/23 07/25/23  Henderly, Britni A, PA-C  sulfamethoxazole -trimethoprim  (BACTRIM  DS) 800-160 MG tablet Take 1 tablet by mouth 2 (two) times daily. 07/06/23   Dennise Lavada POUR, MD    Physical Exam: Vitals:   04/06/24 0215 04/06/24 0230 04/06/24 0430 04/06/24 0445  BP: 106/63  104/67 112/75  Pulse: 63 72 68 68  Resp:   19 16  Temp:   98.3 F (36.8 C) 98.3 F (36.8 C)  TempSrc:   Oral Oral  SpO2: 100% 100%   100%    Constitutional: NAD, pale  Eyes: PERTLA, lids and conjunctivae normal ENMT: Mucous membranes are moist. Posterior pharynx clear of any exudate or lesions.   Neck: supple, no masses  Respiratory: no wheezing, no crackles. No accessory muscle use.  Cardiovascular: S1 & S2 heard, regular rate and rhythm. No extremity edema.   Abdomen: No tenderness, soft. Bowel sounds active.  Musculoskeletal: no clubbing / cyanosis. No joint deformity upper and lower extremities.   Skin: no significant rashes, lesions, ulcers. Warm, dry, well-perfused. Neurologic: CN 2-12 grossly intact. Moving all extremities. Alert and oriented.  Psychiatric: Calm. Cooperative.    Labs and Imaging on Admission: I have personally reviewed following labs and imaging studies  CBC: Recent Labs  Lab 04/05/24 2248  WBC 6.2  HGB 5.0*  HCT 20.2*  MCV 59.1*  PLT 639*   Basic Metabolic Panel: Recent Labs  Lab 04/05/24 2248  NA 136  K 3.5  CL 104  CO2 24  GLUCOSE 111*  BUN 11  CREATININE 0.68  CALCIUM 8.2*   GFR: CrCl cannot be calculated (Unknown ideal weight.). Liver Function Tests: Recent Labs  Lab 04/05/24 2248  AST 16  ALT 11  ALKPHOS 37*  BILITOT 0.5  PROT 6.0*  ALBUMIN 3.6   Recent Labs  Lab 04/05/24 2248  LIPASE 42   No results for input(s): AMMONIA in the last 168 hours. Coagulation Profile: No results for input(s): INR, PROTIME in the last 168 hours. Cardiac Enzymes: No results for input(s): CKTOTAL, CKMB, CKMBINDEX, TROPONINI in the last 168 hours. BNP (last 3 results) No results for input(s): PROBNP in the last 8760 hours. HbA1C: No results for input(s): HGBA1C in the last 72 hours. CBG: No results for input(s): GLUCAP in the last 168 hours. Lipid Profile: No results for input(s): CHOL, HDL, LDLCALC, TRIG, CHOLHDL, LDLDIRECT in the last 72 hours. Thyroid  Function Tests: No results for input(s): TSH, T4TOTAL, FREET4, T3FREE,  THYROIDAB in the last 72 hours. Anemia Panel: No results for input(s): VITAMINB12, FOLATE, FERRITIN, TIBC, IRON, RETICCTPCT in the last 72 hours. Urine analysis:    Component Value Date/Time   COLORURINE YELLOW 07/11/2023 1730   APPEARANCEUR HAZY (A) 07/11/2023 1730   LABSPEC >1.046 (H) 07/11/2023 1730   PHURINE 5.0 07/11/2023 1730   GLUCOSEU NEGATIVE 07/11/2023 1730   HGBUR NEGATIVE 07/11/2023 1730   BILIRUBINUR NEGATIVE 07/11/2023 1730   KETONESUR 80 (A) 07/11/2023 1730   PROTEINUR 100 (A) 07/11/2023 1730   NITRITE NEGATIVE 07/11/2023 1730   LEUKOCYTESUR NEGATIVE 07/11/2023 1730   Sepsis Labs: @LABRCNTIP (procalcitonin:4,lacticidven:4) )No results found for this or any previous visit (from the past 240 hours).   Radiological Exams on Admission: CT ANGIO ABDOMEN PELVIS  W & WO CONTRAST Result Date: 04/06/2024 EXAM: CTA ABDOMEN AND PELVIS WITHOUT AND WITH CONTRAST 04/06/2024 02:34:47 AM TECHNIQUE: CTA images  of the abdomen and pelvis without and with intravenous contrast. 75 mL of iohexol  (OMNIPAQUE ) 350 MG/ML injection was administered. Three-dimensional MIP/volume rendered formations were performed. Automated exposure control, iterative reconstruction, and/or weight based adjustment of the mA/kV was utilized to reduce the radiation dose to as low as reasonably achievable. COMPARISON: 07/11/2023 CLINICAL HISTORY: Lower GI bleed FINDINGS: VASCULATURE: No acute finding. AORTA: No acute finding. No abdominal aortic aneurysm. No dissection. CELIAC TRUNK: No acute finding. No occlusion or significant stenosis. SUPERIOR MESENTERIC ARTERY: No acute finding. No occlusion or significant stenosis. RENAL ARTERIES: No acute finding. No occlusion or significant stenosis. ILIAC ARTERIES: No acute finding. No occlusion or significant stenosis. LIVER: The liver is unremarkable. GALLBLADDER AND BILE DUCTS: Gallbladder is unremarkable. No biliary ductal dilatation. SPLEEN: The spleen is  unremarkable. PANCREAS: The pancreas is unremarkable. ADRENAL GLANDS: Bilateral adrenal glands demonstrate no acute abnormality. KIDNEYS, URETERS AND BLADDER: No stones in the kidneys or ureters. No hydronephrosis. No perinephric or periureteral stranding. Urinary bladder is unremarkable. GI AND BOWEL: Stomach and duodenal sweep demonstrate no acute abnormality. Normal appendix. No evidence of acute GI bleeding. Mild colonic wall thickening of the descending colon. Trace adjacent stranding about the colon at the splenic flexure. The sigmoid colon is decompressed. There is no bowel obstruction. No abnormal bowel wall thickening or distension. REPRODUCTIVE: Reproductive organs are unremarkable. PERITONEUM AND RETROPERITONEUM: Small volume low density free fluid in the pelvis. No ascites or free air. LUNG BASE: No acute abnormality. LYMPH NODES: No lymphadenopathy. BONES AND SOFT TISSUES: Fat containing periumbilical hernia. No acute abnormality of the bones. IMPRESSION: 1. Mild descending colitis. No evidence of acute GI bleeding. Electronically signed by: Norman Gatlin MD 04/06/2024 02:57 AM EST RP Workstation: [id number]    Assessment/Plan   1. Acute on chronic anemia; rectal bleeding  - Hgb 5.0 in ED, down from 10.2 in March 2025  - No active bleeding noted on CTA in ED  - She is having N/V/D now and infectious colitis possible thought no fever or leukocytosis - She is receiving 2 units RBCs in the ED  - Continue bowel rest, trend CBCs, check GI pathogen panel if diarrhea recurs, follow-up on GI recommendations   2. Abnormal uterine bleeding; chronic iron -deficiency anemia  - She reports long hx of heavy menstrual bleeding and has required blood transfusions previously - Not currently menstruating  - OBGYN follow-up and iron  supplementation advised   3. Thrombocytosis  - Likely reactive in setting of IDA and acute blood-loss    DVT prophylaxis: SCDs  Code Status: Full  Level of Care: Level  of care: Progressive Family Communication: None present  Disposition Plan:  Patient is from: Home  Anticipated d/c is to: Home  Anticipated d/c date is: 12/4 or 04/08/24 Patient currently: Pending stable H&H, GI recommendations  Consults called: GI  Admission status: Observation     Maureen Hensley Sprinkles, MD [facility]  04/06/2024, 5:13 AM

## 2024-04-06 NOTE — Progress Notes (Signed)
 Pharmacy consulted for IV iron, outpatient IV iron set up, and vitamin B12 replacement. Discussed with Dr. Arlice.   Venofer 200mg  x1 with outpatient referral. Vitamin B12 1000mcg IM x 7 days followed by oral replacement.   Powell Blush, PharmD, BCCCP

## 2024-04-06 NOTE — Consult Note (Addendum)
 Consultation  Referring Provider: TRH/Opyd Primary Care Physician:  Patient, No Pcp Per Primary Gastroenterologist:  unassigned  Reason for Consultation: Bright red blood per rectum, abdominal pain and cramping  HPI: CHESNI VOS is a 38 y.o. female with history of menorrhagia, who was actually hospitalized in March 2025 with symptomatic anemia and musculoskeletal chest pain.  Hemoglobin 5.7 at that time and was transfused 2 units of packed RBCs.  Per the notes, OB/GYN was contacted/Dr. Fredirick and patient was to be seen as an outpatient within a week. She was discharged on oral iron supplementation.  Patient states today that she ran out of her iron supplement a while back.  She says she was also never given an appointment to see OB/GYN and had tried to call around but was never able to obtain an appointment. She says that she has had very heavy periods over the past 8 or 9 years.  She says usually her menses last for about 7 days and that her bleeding is heavy over the first 6 days usually having to change a pad every 1-2 hours.  She presented to the emergency room yesterday with a new symptom of bright red blood per rectum.  She said she noticed this initially with a bowel movement on Sunday evening with blood noted in the stool and on the tissue.  Then yesterday she had significant abdominal cramping/pain and passed stool mixed with blood twice at home and then a couple of times after arrival to the emergency room.  She also had associated nausea and vomiting, no syncope, no diaphoresis.  She had not on any new medications, or done anything differently prior to this acute episode. She feels better today has not had any further bowel movements or bleeding.  Workup in the ER with CT angiography shows no evidence of active bleeding, there is some mild colonic wall thickening in the left colon and trace stranding at the splenic flexure. Labs on 04/05/2024 with WBC 6.2/hemoglobin 5/hematocrit  20.2/MCV 59/platelets 639 Potassium 3.5/BUN 10/creatinine 0.68 LFTs within normal limits Today hemoglobin 7.8/hematocrit 27.8 post 2 units of packed RBCs. Ferritin 1/serum iron 19/TIBC 479/iron sat 4/B12 low at less than 150 GI path panel pending  Reviewing previous labs as noted above hemoglobin is low as 5.7 in March 2025 at the time of discharge hemoglobin 10.2.  [place] she also had a CT in March 2025 when she was complaining of epigastric pain and was noted to have other antral wall thickening suggesting possible gastritis or ulcer disease.  Family history is negative for GI disease as far she is aware.  No regular aspirin or NSAIDs.   Past Medical History:  Diagnosis Date   Anemia     Past Surgical History:  Procedure Laterality Date   CESAREAN SECTION     3   ECTOPIC PREGNANCY SURGERY      Prior to Admission medications   Medication Sig Start Date End Date Taking? Authorizing Provider  ferrous sulfate  325 (65 FE) MG tablet Take 1 tablet (325 mg total) by mouth 2 (two) times daily with a meal. 07/06/23   Dennise Lavada POUR, MD  folic acid  (FOLVITE ) 1 MG tablet Take 1 tablet (1 mg total) by mouth daily. 07/07/23   Singh, Prashant K, MD  naproxen  (NAPROSYN ) 500 MG tablet Take 1 tablet (500 mg total) by mouth every 12 (twelve) hours as needed for moderate pain (pain score 4-6) or mild pain (pain score 1-3). 07/01/23   Amyot, Jenkins Lesches,  NP  ondansetron  (ZOFRAN -ODT) 4 MG disintegrating tablet Take 1 tablet (4 mg total) by mouth every 8 (eight) hours as needed for nausea or vomiting. 07/10/23   Robinson, John K, PA-C  pantoprazole  (PROTONIX ) 20 MG tablet Take 1 tablet (20 mg total) by mouth daily. 07/10/23 08/09/23  Robinson, John K, PA-C  promethazine  (PHENERGAN ) 25 MG tablet Take 1 tablet (25 mg total) by mouth every 6 (six) hours as needed for nausea or vomiting. 07/11/23   Henderly, Britni A, PA-C  sucralfate  (CARAFATE ) 1 g tablet Take 1 tablet (1 g total) by mouth 4 (four) times daily -   with meals and at bedtime for 14 days. 07/11/23 07/25/23  Henderly, Britni A, PA-C  sulfamethoxazole -trimethoprim  (BACTRIM  DS) 800-160 MG tablet Take 1 tablet by mouth 2 (two) times daily. 07/06/23   Singh, Prashant K, MD    Current Facility-Administered Medications  Medication Dose Route Frequency Provider Last Rate Last Admin   acetaminophen  (TYLENOL ) tablet 650 mg  650 mg Oral Q6H PRN Opyd, Timothy S, MD       Or   acetaminophen  (TYLENOL ) suppository 650 mg  650 mg Rectal Q6H PRN Opyd, Timothy S, MD       cyanocobalamin  (VITAMIN B12) injection 1,000 mcg  1,000 mcg Intramuscular Daily Dahal, Binaya, MD       Followed by   NOREEN ON 04/13/2024] vitamin B-12 (CYANOCOBALAMIN ) tablet 100 mcg  100 mcg Oral Daily Dahal, Binaya, MD       fentaNYL (SUBLIMAZE) injection 50 mcg  50 mcg Intravenous Q2H PRN Opyd, Timothy S, MD       iron sucrose (VENOFER) 200 mg in sodium chloride  0.9 % 100 mL IVPB  200 mg Intravenous Once Dahal, Binaya, MD       lactated ringers infusion   Intravenous Continuous Opyd, Timothy S, MD 75 mL/hr at 04/06/24 0618 New Bag at 04/06/24 0618   oxyCODONE  (Oxy IR/ROXICODONE ) immediate release tablet 5 mg  5 mg Oral Q4H PRN Opyd, Timothy S, MD       prochlorperazine (COMPAZINE) injection 5 mg  5 mg Intravenous Q6H PRN Opyd, Timothy S, MD       sodium chloride  flush (NS) 0.9 % injection 3 mL  3 mL Intravenous Q12H Opyd, Timothy S, MD       Current Outpatient Medications  Medication Sig Dispense Refill   ferrous sulfate  325 (65 FE) MG tablet Take 1 tablet (325 mg total) by mouth 2 (two) times daily with a meal. 60 tablet 0   folic acid  (FOLVITE ) 1 MG tablet Take 1 tablet (1 mg total) by mouth daily. 30 tablet 0   naproxen  (NAPROSYN ) 500 MG tablet Take 1 tablet (500 mg total) by mouth every 12 (twelve) hours as needed for moderate pain (pain score 4-6) or mild pain (pain score 1-3). 20 tablet 0   ondansetron  (ZOFRAN -ODT) 4 MG disintegrating tablet Take 1 tablet (4 mg total) by mouth every  8 (eight) hours as needed for nausea or vomiting. 20 tablet 0   pantoprazole  (PROTONIX ) 20 MG tablet Take 1 tablet (20 mg total) by mouth daily. 30 tablet 0   promethazine  (PHENERGAN ) 25 MG tablet Take 1 tablet (25 mg total) by mouth every 6 (six) hours as needed for nausea or vomiting. 15 tablet 0   sucralfate  (CARAFATE ) 1 g tablet Take 1 tablet (1 g total) by mouth 4 (four) times daily -  with meals and at bedtime for 14 days. 56 tablet 0   sulfamethoxazole -trimethoprim  (BACTRIM  DS)  800-160 MG tablet Take 1 tablet by mouth 2 (two) times daily. 14 tablet 0    Allergies as of 04/05/2024   (No Known Allergies)    History reviewed. No pertinent family history.  Social History   Socioeconomic History   Marital status: Single    Spouse name: Not on file   Number of children: Not on file   Years of education: Not on file   Highest education level: Not on file  Occupational History   Not on file  Tobacco Use   Smoking status: Former    Types: Cigarettes   Smokeless tobacco: Never  Vaping Use   Vaping status: Never Used  Substance and Sexual Activity   Alcohol use: Not Currently   Drug use: Not Currently   Sexual activity: Yes    Birth control/protection: None  Other Topics Concern   Not on file  Social History Narrative   Not on file   Social Drivers of Health   Financial Resource Strain: Not on file  Food Insecurity: No Food Insecurity (07/05/2023)   Hunger Vital Sign    Worried About Running Out of Food in the Last Year: Never true    Ran Out of Food in the Last Year: Never true  Transportation Needs: No Transportation Needs (07/05/2023)   PRAPARE - Administrator, Civil Service (Medical): No    Lack of Transportation (Non-Medical): No  Physical Activity: Not on file  Stress: Not on file  Social Connections: Moderately Isolated (07/05/2023)   Social Connection and Isolation Panel    Frequency of Communication with Friends and Family: More than three times a week     Frequency of Social Gatherings with Friends and Family: More than three times a week    Attends Religious Services: Never    Database Administrator or Organizations: Yes    Attends Banker Meetings: Never    Marital Status: Separated  Intimate Partner Violence: Not At Risk (07/05/2023)   Humiliation, Afraid, Rape, and Kick questionnaire    Fear of Current or Ex-Partner: No    Emotionally Abused: No    Physically Abused: No    Sexually Abused: No    Review of Systems: Pertinent positive and negative review of systems were noted in the above HPI section.  All other review of systems was otherwise negative.  Physical Exam: Vital signs in last 24 hours: Temp:  [97.8 F (36.6 C)-98.8 F (37.1 C)] 98.3 F (36.8 C) (12/03 0936) Pulse Rate:  [61-92] 61 (12/03 1045) Resp:  [14-23] 23 (12/03 1045) BP: (99-119)/(49-87) 119/73 (12/03 1045) SpO2:  [99 %-100 %] 100 % (12/03 1045)   General:   Alert,  Well-developed, well-nourished, young female pleasant and cooperative in NAD Head:  Normocephalic and atraumatic. Eyes:  Sclera clear, no icterus.   Conjunctiva pale Ears:  Normal auditory acuity. Nose:  No deformity, discharge,  or lesions. Mouth:  No deformity or lesions.   Neck:  Supple; no masses or thyromegaly. Lungs:  Clear throughout to auscultation.   No wheezes, crackles, or rhonchi . Heart:  Regular rate and rhythm; no murmurs, clicks, rubs,  or gallops. Abdomen:  Soft,nontender, BS active,nonpalp mass or hsm.   Rectal: Not done Msk:  Symmetrical without gross deformities. . Pulses:  Normal pulses noted. Extremities:  Without clubbing or edema. Neurologic:  Alert and  oriented x4;  grossly normal neurologically. Skin:  Intact without significant lesions or rashes.. Psych:  Alert and cooperative. Normal mood and  affect.  Intake/Output from previous day: No intake/output data recorded. Intake/Output this shift: Total I/O In: 71.3 [I.V.:71.3] Out: -   Lab  Results: Recent Labs    04/05/24 2248 04/06/24 0824  WBC 6.2 8.1  HGB 5.0* 7.8*  HCT 20.2* 27.8*  PLT 639* 538*   BMET Recent Labs    04/05/24 2248  NA 136  K 3.5  CL 104  CO2 24  GLUCOSE 111*  BUN 11  CREATININE 0.68  CALCIUM 8.2*   LFT Recent Labs    04/05/24 2248  PROT 6.0*  ALBUMIN 3.6  AST 16  ALT 11  ALKPHOS 37*  BILITOT 0.5   PT/INR No results for input(s): LABPROT, INR in the last 72 hours. Hepatitis Panel No results for input(s): HEPBSAG, HCVAB, HEPAIGM, HEPBIGM in the last 72 hours.   IMPRESSION:  #78 38 year old female with history of chronic iron deficiency anemia felt on the basis of menorrhagia.  She does not have a formal diagnosis of endometriosis though that is listed in her chart. Hospitalized March 2025 and required transfusions.  She was to have GYN evaluation but that has never occurred.  She presents to the ER now after an acute episode of abdominal pain cramping followed by bloody stools.  She had also noted blood in her stool on "Sunday evening prior to the cramping starting.  Hemoglobin 5 on admission, severely iron deficient CT imaging raises a question of some mild colonic wall thickening of the left colon and some stranding at the splenic flexure.  I think her severe chronic iron deficiency is primarily being driven by the menorrhagia.  Query if this acute episode was a mild segmental ischemic colitis which she is already symptomatically.  However given the severity and recurrence of severe anemia/iron deficiency further GI endoscopic workup is indicated to rule out other etiologies for her severe iron deficiency anemia.  #2 B12 deficiency-new diagnosis   PLAN: Patient received IV iron infusion today has received 2 units of packed RBCs Continue to trend hemoglobin and transfuse as indicated Liquid diet today, n.p.o. after midnight Discussed endoscopic evaluation with the patient to include colonoscopy and EGD.  We  discussed indications risks and benefits and she is agreeable to proceed.  Procedures will be scheduled with Dr. Ashima Shrake for tomorrow 04/07/2024, with bowel prep this evening.  GYN needs to see patient during this admission or she needs a definite appointment scheduled prior to discharge.  GI will follow with you   Amy Esterwood PA-C 04/06/2024, 11:38 AM   Attending physician's note  I personally saw the patient and performed a substantive portion of the medical decision making process for this encounter (including a complete performance of the key components : MDM, Hx and Exam), in conjunction with the APP.  I agree with the APP's note, impression, and  the management plan for the number and complexity of problems addressed at the encounter for the patient and take responsibility for that plan with its inherent risk of complications, morbidity, or mortality with additional input as follows.     38"  year old female with history of severe symptomatic anemia, abdominal pain and cramping Severe iron deficiency   Will plan to proceed with EGD and colonoscopy for further evaluation of severe iron deficiency Monitor hemoglobin and transfuse if below 7 IV iron infusion Bowel prep Clear liquid diet  The risks and benefits as well as alternatives of endoscopic procedure(s) have been discussed and reviewed. All questions answered. The patient agrees to proceed.   The  patient was provided an opportunity to ask questions and all were answered. The patient agreed with the plan and demonstrated an understanding of the instructions.  LOIS Wilkie Mcgee , MD [phone]

## 2024-04-07 ENCOUNTER — Ambulatory Visit (HOSPITAL_COMMUNITY): Payer: Self-pay | Admitting: Anesthesiology

## 2024-04-07 ENCOUNTER — Encounter (HOSPITAL_COMMUNITY): Admission: EM | Disposition: A | Payer: Self-pay | Source: Home / Self Care | Attending: Emergency Medicine

## 2024-04-07 ENCOUNTER — Encounter (HOSPITAL_COMMUNITY): Payer: Self-pay | Admitting: Family Medicine

## 2024-04-07 ENCOUNTER — Inpatient Hospital Stay (HOSPITAL_COMMUNITY): Payer: Self-pay | Admitting: Anesthesiology

## 2024-04-07 ENCOUNTER — Other Ambulatory Visit (HOSPITAL_COMMUNITY): Payer: Self-pay

## 2024-04-07 DIAGNOSIS — K2950 Unspecified chronic gastritis without bleeding: Secondary | ICD-10-CM | POA: Diagnosis not present

## 2024-04-07 DIAGNOSIS — K648 Other hemorrhoids: Secondary | ICD-10-CM

## 2024-04-07 DIAGNOSIS — K2970 Gastritis, unspecified, without bleeding: Secondary | ICD-10-CM

## 2024-04-07 DIAGNOSIS — K625 Hemorrhage of anus and rectum: Secondary | ICD-10-CM

## 2024-04-07 DIAGNOSIS — D62 Acute posthemorrhagic anemia: Secondary | ICD-10-CM | POA: Diagnosis not present

## 2024-04-07 HISTORY — PX: COLONOSCOPY: SHX5424

## 2024-04-07 HISTORY — PX: ESOPHAGOGASTRODUODENOSCOPY: SHX5428

## 2024-04-07 LAB — BASIC METABOLIC PANEL WITH GFR
Anion gap: 6 (ref 5–15)
BUN: <5 mg/dL — ABNORMAL LOW (ref 6–20)
CO2: 26 mmol/L (ref 22–32)
Calcium: 8.8 mg/dL — ABNORMAL LOW (ref 8.9–10.3)
Chloride: 106 mmol/L (ref 98–111)
Creatinine, Ser: 0.73 mg/dL (ref 0.44–1.00)
GFR, Estimated: >60 mL/min (ref >60)
Glucose, Bld: 95 mg/dL (ref 70–99)
Potassium: 3.6 mmol/L (ref 3.5–5.1)
Sodium: 138 mmol/L (ref 135–145)

## 2024-04-07 LAB — CBC
HCT: 27.8 % — ABNORMAL LOW (ref 36.0–46.0)
HCT: 28.5 % — ABNORMAL LOW (ref 36.0–46.0)
Hemoglobin: 8.1 g/dL — ABNORMAL LOW (ref 12.0–15.0)
Hemoglobin: 8.2 g/dL — ABNORMAL LOW (ref 12.0–15.0)
MCH: 18.6 pg — ABNORMAL LOW (ref 26.0–34.0)
MCH: 18.8 pg — ABNORMAL LOW (ref 26.0–34.0)
MCHC: 28.8 g/dL — ABNORMAL LOW (ref 30.0–36.0)
MCHC: 29.1 g/dL — ABNORMAL LOW (ref 30.0–36.0)
MCV: 64.7 fL — ABNORMAL LOW (ref 80.0–100.0)
MCV: 64.8 fL — ABNORMAL LOW (ref 80.0–100.0)
Platelets: 528 K/uL — ABNORMAL HIGH (ref 150–400)
Platelets: 529 K/uL — ABNORMAL HIGH (ref 150–400)
RBC: 4.3 MIL/uL (ref 3.87–5.11)
RBC: 4.4 MIL/uL (ref 3.87–5.11)
RDW: 31.8 % — ABNORMAL HIGH (ref 11.5–15.5)
RDW: 32.1 % — ABNORMAL HIGH (ref 11.5–15.5)
WBC: 5.7 K/uL (ref 4.0–10.5)
WBC: 6.3 K/uL (ref 4.0–10.5)
nRBC: 0 % (ref 0.0–0.2)
nRBC: 0 % (ref 0.0–0.2)

## 2024-04-07 LAB — BPAM RBC
Blood Product Expiration Date: 202512232359
Blood Product Expiration Date: 202512232359
ISSUE DATE / TIME: 202512030040
ISSUE DATE / TIME: 202512030422
Unit Type and Rh: 7300
Unit Type and Rh: 7300

## 2024-04-07 LAB — TYPE AND SCREEN
ABO/RH(D): B POS
Antibody Screen: NEGATIVE
Unit division: 0
Unit division: 0

## 2024-04-07 SURGERY — COLONOSCOPY
Anesthesia: Monitor Anesthesia Care

## 2024-04-07 MED ORDER — PANTOPRAZOLE SODIUM 40 MG PO TBEC
40.0000 mg | DELAYED_RELEASE_TABLET | Freq: Every day | ORAL | Status: DC
  Administered 2024-04-07: 40 mg via ORAL
  Filled 2024-04-07: qty 1

## 2024-04-07 MED ORDER — LIDOCAINE 2% (20 MG/ML) 5 ML SYRINGE
INTRAMUSCULAR | Status: DC | PRN
  Administered 2024-04-07: 80 mg via INTRAVENOUS

## 2024-04-07 MED ORDER — CYANOCOBALAMIN 100 MCG PO TABS
100.0000 ug | ORAL_TABLET | Freq: Every day | ORAL | 0 refills | Status: DC
  Filled 2024-04-07: qty 90, 90d supply, fill #0

## 2024-04-07 MED ORDER — HYDROCORTISONE ACETATE 25 MG RE SUPP
25.0000 mg | Freq: Every day | RECTAL | 0 refills | Status: AC
  Filled 2024-04-07: qty 12, 12d supply, fill #0

## 2024-04-07 MED ORDER — POLYETHYLENE GLYCOL 3350 17 GM/SCOOP PO POWD
17.0000 g | Freq: Every day | ORAL | 0 refills | Status: AC | PRN
  Filled 2024-04-07: qty 238, 14d supply, fill #0

## 2024-04-07 MED ORDER — HYDROCORTISONE ACETATE 25 MG RE SUPP
25.0000 mg | Freq: Every day | RECTAL | Status: DC
  Filled 2024-04-07: qty 1

## 2024-04-07 MED ORDER — FERROUS SULFATE 325 (65 FE) MG PO TABS
325.0000 mg | ORAL_TABLET | Freq: Every day | ORAL | 0 refills | Status: AC
  Filled 2024-04-07: qty 90, 90d supply, fill #0

## 2024-04-07 MED ORDER — POLYETHYLENE GLYCOL 3350 17 G PO PACK: 17.0000 g | PACK | Freq: Every day | ORAL | Status: DC | PRN

## 2024-04-07 MED ORDER — PROPOFOL 10 MG/ML IV BOLUS
INTRAVENOUS | Status: DC | PRN
  Administered 2024-04-07: 50 mg via INTRAVENOUS
  Administered 2024-04-07 (×3): 30 mg via INTRAVENOUS

## 2024-04-07 MED ORDER — PROPOFOL 500 MG/50ML IV EMUL
INTRAVENOUS | Status: DC | PRN
  Administered 2024-04-07: 175 ug/kg/min via INTRAVENOUS

## 2024-04-07 NOTE — Transfer of Care (Signed)
 Immediate Anesthesia Transfer of Care Note  Patient: Maureen Hensley  Procedure(s) Performed: COLONOSCOPY EGD (ESOPHAGOGASTRODUODENOSCOPY)  Patient Location: PACU and Endoscopy Unit  Anesthesia Type:MAC  Level of Consciousness: drowsy  Airway & Oxygen Therapy: Patient Spontanous Breathing and Patient connected to face mask oxygen  Post-op Assessment: Report given to RN and Post -op Vital signs reviewed and stable  Post vital signs: Reviewed and stable  Last Vitals:  Vitals Value Taken Time  BP 95/62 04/07/24 11:02  Temp 36.4 C 04/07/24 11:02  Pulse 77 04/07/24 11:03  Resp 19 04/07/24 11:03  SpO2 100 % 04/07/24 11:03  Vitals shown include unfiled device data.  Last Pain:  Vitals:   04/07/24 1102  TempSrc: Temporal  PainSc:          Complications: No notable events documented.

## 2024-04-07 NOTE — Anesthesia Postprocedure Evaluation (Signed)
 Anesthesia Post Note  Patient: Maureen Hensley  Procedure(s) Performed: COLONOSCOPY EGD (ESOPHAGOGASTRODUODENOSCOPY)     Patient location during evaluation: Endoscopy Anesthesia Type: MAC Level of consciousness: awake and alert Pain management: pain level controlled Vital Signs Assessment: post-procedure vital signs reviewed and stable Respiratory status: spontaneous breathing, nonlabored ventilation, respiratory function stable and patient connected to nasal cannula oxygen Cardiovascular status: stable and blood pressure returned to baseline Postop Assessment: no apparent nausea or vomiting Anesthetic complications: no   No notable events documented.  Last Vitals:  Vitals:   04/07/24 1120 04/07/24 1130  BP: 112/73 112/73  Pulse: 68 62  Resp: 14 (!) 8  Temp: 37 C   SpO2: 100% 100%    Last Pain:  Vitals:   04/07/24 1120  TempSrc: Oral  PainSc: 0-No pain                 Rome Ade

## 2024-04-07 NOTE — Plan of Care (Signed)

## 2024-04-07 NOTE — Plan of Care (Signed)
  Problem: Education: Goal: Knowledge of General Education information will improve Description: Including pain rating scale, medication(s)/side effects and non-pharmacologic comfort measures Outcome: Adequate for Discharge   Problem: Health Behavior/Discharge Planning: Goal: Ability to manage health-related needs will improve 04/07/2024 1517 by Gail Cathryne SAILOR, RN Outcome: Adequate for Discharge 04/07/2024 0801 by Gail Cathryne SAILOR, RN Outcome: Progressing   Problem: Clinical Measurements: Goal: Ability to maintain clinical measurements within normal limits will improve Outcome: Adequate for Discharge Goal: Will remain free from infection Outcome: Adequate for Discharge Goal: Diagnostic test results will improve Outcome: Adequate for Discharge Goal: Respiratory complications will improve Outcome: Adequate for Discharge Goal: Cardiovascular complication will be avoided Outcome: Adequate for Discharge   Problem: Activity: Goal: Risk for activity intolerance will decrease Outcome: Adequate for Discharge   Problem: Nutrition: Goal: Adequate nutrition will be maintained Outcome: Adequate for Discharge   Problem: Coping: Goal: Level of anxiety will decrease Outcome: Adequate for Discharge   Problem: Elimination: Goal: Will not experience complications related to bowel motility Outcome: Adequate for Discharge Goal: Will not experience complications related to urinary retention Outcome: Adequate for Discharge   Problem: Pain Managment: Goal: General experience of comfort will improve and/or be controlled Outcome: Adequate for Discharge   Problem: Safety: Goal: Ability to remain free from injury will improve Outcome: Adequate for Discharge   Problem: Skin Integrity: Goal: Risk for impaired skin integrity will decrease Outcome: Adequate for Discharge

## 2024-04-07 NOTE — Interval H&P Note (Signed)
 History and Physical Interval Note:  04/07/2024 9:33 AM  Maureen Hensley  has presented today for surgery, with the diagnosis of Severe iron deficiency anemia, active bleeding, possible colitis on CT.  The various methods of treatment have been discussed with the patient and family. After consideration of risks, benefits and other options for treatment, the patient has consented to  Procedure(s): COLONOSCOPY (N/A) EGD (ESOPHAGOGASTRODUODENOSCOPY) (N/A) as a surgical intervention.  The patient's history has been reviewed, patient examined, no change in status, stable for surgery.  I have reviewed the patient's chart and labs.  Questions were answered to the patient's satisfaction.     Kapri Nero

## 2024-04-07 NOTE — Op Note (Signed)
 [facility] Patient Name: Maureen Hensley Procedure Date : 04/07/2024 MRN: [id number] Attending MD: Gustav ALONSO Mcgee , MD, [id number] Date of Birth: June 02, 1985 CSN: [id number] Age: 38 Admit Type: Inpatient Procedure:                Upper GI endoscopy Indications:              Suspected upper gastrointestinal bleeding in                            patient with unexplained iron deficiency anemia Providers:                Gustav ALONSO Mcgee, MD, Olam Riedel, RN, Coye Bade, Technician Referring MD:              Medicines:                Monitored Anesthesia Care Complications:            No immediate complications. Estimated Blood Loss:     Estimated blood loss was minimal. Procedure:                Pre-Anesthesia Assessment:                           - Prior to the procedure, a History and Physical                            was performed, and patient medications and                            allergies were reviewed. The patient's tolerance of                            previous anesthesia was also reviewed. The risks                            and benefits of the procedure and the sedation                            options and risks were discussed with the patient.                            All questions were answered, and informed consent                            was obtained. Prior Anticoagulants: The patient has                            taken no anticoagulant or antiplatelet agents. ASA                            Grade Assessment: II - A patient with mild systemic  disease. After reviewing the risks and benefits,                            the patient was deemed in satisfactory condition to                            undergo the procedure.                           After obtaining informed consent, the endoscope was                            passed under direct vision. Throughout the                             procedure, the patient's blood pressure, pulse, and                            oxygen saturations were monitored continuously. The                            [id number]-[id number] ([id number]) Olympus endoscope was introduced                            through the mouth, and advanced to the second part                            of duodenum. The upper GI endoscopy was                            accomplished without difficulty. The patient                            tolerated the procedure well. Scope In: Scope Out: Findings:      The Z-line was regular and was found 38 cm from the incisors.      Patchy mild inflammation characterized by congestion (edema), erosions       and friability was found in the gastric antrum and in the prepyloric       region of the stomach. Biopsies were taken with a cold forceps for       Helicobacter pylori testing.      The cardia and gastric fundus were normal on retroflexion.      The examined duodenum was normal. Impression:               - Z-line regular, 38 cm from the incisors.                           - Gastritis. Biopsied.                           - Normal examined duodenum. Recommendation:           - Resume previous diet.                           -  Continue present medications.                           - Await pathology results.                           - Use Protonix  (pantoprazole ) 40 mg PO daily X 3                            months                           - No high dose aspirin, ibuprofen , naproxen , or                            other non-steroidal anti-inflammatory drugs. Procedure Code(s):        --- Professional ---                           [phone], Esophagogastroduodenoscopy, flexible,                            transoral; with biopsy, single or multiple Diagnosis Code(s):        --- Professional ---                           K29.70, Gastritis, unspecified, without bleeding                           D50.9, Iron  deficiency anemia,  unspecified CPT copyright 2022 American Medical Association. All rights reserved. The codes documented in this report are preliminary and upon coder review may  be revised to meet current compliance requirements. [name], MD 04/07/2024 11:16:23 AM This report has been signed electronically. Number of Addenda: 0

## 2024-04-07 NOTE — TOC CM/SW Note (Addendum)
 Transition of Care [facility]) - Inpatient Brief Assessment   Patient Details  Name: Maureen Hensley MRN: [id number] Date of Birth: 1985-10-03  Transition of Care [facility]) CM/SW Contact:    [place] Barnie Rama, RN Phone Number: 04/07/2024, 2:09 PM   Clinical Narrative: From home with family, has no  PCP  and has insurance on file, states has no HH services in place at this time or DME at home.  States she may need a cab voucher at [employer] and family is support system, states gets medications from [facility] on [address].  Pta self ambulatory.  NCM will assist with PCP.  NCM scheduled a hospital follow up on [facility] at [facility].    Transition of Care Asessment: Insurance and Status: Insurance coverage has been reviewed Patient has primary care physician: No Home environment has been reviewed: home with family Prior level of function:: indep Prior/Current Home Services: No current home services Social Drivers of Health Review: SDOH reviewed no interventions necessary Readmission risk has been reviewed: Yes Transition of care needs: transition of care needs identified, TOC will continue to follow

## 2024-04-07 NOTE — TOC Transition Note (Signed)
 Transition of Care [facility]) - Discharge Note   Patient Details  Name: Maureen Hensley MRN: [id number] Date of Birth: Aug 02, 1985  Transition of Care [facility]) CM/SW Contact:  [place] Barnie Rama, RN Phone Number: 04/07/2024, 2:54 PM   Clinical Narrative:    For dc today, she will need cab voucher at dc.           Patient Goals and CMS Choice            Discharge Placement                       Discharge Plan and Services Additional resources added to the After Visit Summary for                                       Social Drivers of Health (SDOH) Interventions SDOH Screenings   Food Insecurity: Food Insecurity Present (04/06/2024)  Housing: Low Risk  (04/06/2024)  Transportation Needs: No Transportation Needs (04/06/2024)  Utilities: Not At Risk (04/06/2024)  Social Connections: Moderately Isolated (07/05/2023)  Tobacco Use: Medium Risk (04/07/2024)     Readmission Risk Interventions    04/07/2024    2:15 PM 04/07/2024    2:08 PM  Readmission Risk Prevention Plan  Post Dischage Appt Complete   Medication Screening Complete Complete  Transportation Screening Complete Complete

## 2024-04-07 NOTE — Anesthesia Preprocedure Evaluation (Signed)
 Anesthesia Evaluation  Patient identified by MRN, date of birth, ID band Patient awake    Reviewed: Allergy & Precautions, NPO status , Patient's Chart, lab work & pertinent test results  History of Anesthesia Complications Negative for: history of anesthetic complications  Airway Mallampati: III  TM Distance: >3 FB Neck ROM: Full    Dental  (+) Poor Dentition   Pulmonary neg pulmonary ROS, neg sleep apnea, neg COPD, Patient abstained from smoking.Not current smoker, former smoker   Pulmonary exam normal breath sounds clear to auscultation       Cardiovascular Exercise Tolerance: Good METS(-) hypertension(-) CAD and (-) Past MI negative cardio ROS (-) dysrhythmias  Rhythm:Regular Rate:Normal - Systolic murmurs    Neuro/Psych  Headaches  negative psych ROS   GI/Hepatic ,neg GERD  ,,(+)     (-) substance abuse    Endo/Other  neg diabetes    Renal/GU negative Renal ROS     Musculoskeletal   Abdominal   Peds  Hematology  (+) Blood dyscrasia, anemia   Anesthesia Other Findings Past Medical History: No date: Anemia  Reproductive/Obstetrics                              Anesthesia Physical Anesthesia Plan  ASA: 2  Anesthesia Plan: MAC   Post-op Pain Management: Minimal or no pain anticipated   Induction: Intravenous  PONV Risk Score and Plan: 2 and Propofol  infusion, TIVA and Ondansetron   Airway Management Planned: Nasal Cannula  Additional Equipment: None  Intra-op Plan:   Post-operative Plan:   Informed Consent: I have reviewed the patients History and Physical, chart, labs and discussed the procedure including the risks, benefits and alternatives for the proposed anesthesia with the patient or authorized representative who has indicated his/her understanding and acceptance.     Dental advisory given  Plan Discussed with: CRNA and Surgeon  Anesthesia Plan Comments:  (Discussed risks of anesthesia with patient, including possibility of difficulty with spontaneous ventilation under anesthesia necessitating airway intervention, PONV, and rare risks such as cardiac or respiratory or neurological events, and allergic reactions. Discussed the role of CRNA in patient's perioperative care. Patient understands.)        Anesthesia Quick Evaluation

## 2024-04-07 NOTE — Discharge Summary (Signed)
 Physician Discharge Summary  Maureen Hensley FMW:[id number] DOB: 18-Sep-1985 DOA: 04/05/2024  PCP: Patient, No Pcp Per  Admit date: 04/05/2024 Discharge date: 04/07/2024  Admitted from: Home Discharge disposition: Home  Recommendations at discharge:  You have been started on MiraLAX daily to prevent constipation. Also started on hydrocortisone suppository daily at bedtime for 7 days.   He has been started on daily iron supplement and daily vitamin B12.  Need to follow-up at infusion center for IV iron. Need to follow-up at GI clinic at the next available appointment for hemorrhoid band ligation with Dr. Shila.  Subjective: Patient was seen and examined this afternoon. Underwent colonoscopy this morning.  Noted to have bleeding internal hemorrhoids, otherwise unremarkable colonoscopy.  Afebrile, hemodynamically stable Labs from this morning with hemoglobin stable at 8.1, renal function stable  Brief narrative: Maureen Hensley is a 38 y.o. female with PMH significant for chronic menorrhagia, chronic iron deficiency. 12/2, patient presented to the ED with complaint of lower abdominal pain, BRBPR for 3 to 4 days.  In the ED, patient also had nausea, nonbloody vomiting and several episodes of bloody diarrhea. Labs showed WC count 6.2, hemoglobin 5, MCV 59 CT angio abdomen showed mild descending colitis without evidence of active GI bleeding. 2 units of PRBC transfusion ordered [facility] consulted Admitted to [facility]  Assessment and plan: Acute lower GI bleeding  Presented with BRBPR.  No prior history of GI bleeding. In the ED, had witnessed nonbloody vomiting and bloody diarrhea [facility] was consulted 12/4: Underwent colonoscopy. Noted to have bleeding internal hemorrhoids, otherwise unremarkable colonoscopy. GI recommended MiraLAX daily to prevent constipation. Also started on hydrocortisone suppository daily at bedtime for 7 days.  Patient needs to follow-up at GI clinic at the  next available appointment for hemorrhoid band ligation with Dr. Shila.  Acute on chronic blood loss anemia  Chronic severe iron deficiency anemia Chronic vitamin B12 deficiency Patient has mild chronic anemia with severe iron deficiency secondary to chronic menorrhagia  And anemia panel as below.  Ferritin level was just 1 in March 2025.  Apparently noncompliant to iron supplement lately. 2 units PRBC transfusion given with improvement in hemoglobin above 8.  Stable. Repeat anemia panel this admission showed significantly low ferritin at 1, low vitamin B12 at 150. Given IV Venofer, started on IM vitamin B12. Started on oral iron and oral vitamin B12 at discharge. Recent Labs    07/04/23 2307 07/05/23 0320 04/05/24 2248 04/06/24 0824 04/06/24 0830 04/06/24 2112 04/07/24 0002 04/07/24 0758  HGB  --    < > 5.0* 7.8*  --  7.9* 8.1* 8.2*  MCV  --    < > 59.1* 66.5*  --  65.1* 64.7* 64.8*  VITAMINB12 129*  --   --  <150*  --   --   --   --   FOLATE 12.8  --   --   --  10.1  --   --   --   FERRITIN 1*  --   --  1*  --   --   --   --   TIBC 494*  --   --  479*  --   --   --   --   IRON 8*  --   --  19*  --   --   --   --   RETICCTPCT 1.3  --   --   --  0.6  --   --   --    < > =  values in this interval not displayed.   Chronic menorrhagia Currently not having vaginal bleeding Needs to follow-up with OB/GYN  Chronic thrombocytosis  Likely reactive in setting of IDA   Nutrition Status:         Goals of care   Code Status: Full Code   Diet:  Diet Order             Diet regular Room service appropriate? Yes; Fluid consistency: Thin  Diet effective now           Diet general                   Nutritional status:  Body mass index is 25.24 kg/m.       Wounds:  -    Discharge Medications:   Allergies as of 04/07/2024   No Known Allergies      Medication List     TAKE these medications    cyanocobalamin  100 MCG tablet Take 1 tablet (100 mcg  total) by mouth daily. Start taking on: April 13, 2024   ferrous sulfate  325 (65 FE) MG tablet Take 1 tablet (325 mg total) by mouth daily with breakfast.   hydrocortisone 25 MG suppository Commonly known as: ANUSOL-HC Place 1 suppository (25 mg total) rectally at bedtime.   polyethylene glycol 17 g packet Commonly known as: MIRALAX / GLYCOLAX Take 17 g by mouth daily as needed for moderate constipation.         Follow ups:    Follow-up Information     Connect with your PCP/Specialist as discussed. Schedule an appointment as soon as possible for a visit .   Contact information: [email or web address] Call our physician referral line at [phone].        Gari Lauraine BRAVO, FNP Follow up on 04/19/2024.   Specialty: Family Medicine Why: 12:55  for hospital follow up apt, please bring copay, ID  and any medications you take Contact information: [address] [place] [address] [place] [address] [phone]                 Discharge Instructions:   Discharge Instructions     Amb Referral to Intravenous Iron Therapy   Complete by: As directed    You have been referred to [facility] Infusion team for IV Iron Infusions. The infusion pharmacy team will reach out to you with appointment information.    Primary Diagnosis Code for IV Iron: D50.0 - Iron deficiency Anemia secondary to blood loss (Chronic)   Secondary diagnosis code for IV iron: Other   Comment: chronic menorrhagia   Call MD for:  difficulty breathing, headache or visual disturbances   Complete by: As directed    Call MD for:  extreme fatigue   Complete by: As directed    Call MD for:  hives   Complete by: As directed    Call MD for:  persistant dizziness or light-headedness   Complete by: As directed    Call MD for:  persistant nausea and vomiting   Complete by: As directed    Call MD for:  severe uncontrolled pain   Complete by: As directed    Call MD for:  temperature  >100.4   Complete by: As directed    Diet general   Complete by: As directed    Discharge instructions   Complete by: As directed    Recommendations at discharge:   You have been started on MiraLAX daily to prevent constipation. Also started on hydrocortisone suppository  daily at bedtime for 7 days.    He has been started on daily iron  supplement and daily vitamin B12.  Need to follow-up at infusion center for IV iron .  Need to follow-up at GI clinic at the next available appointment for hemorrhoid band ligation with Dr. Shila.  General discharge instructions: Follow with Primary MD Patient, No Pcp Per in 7 days  Please request your PCP  to go over your hospital tests, procedures, radiology results at the follow up. Please get your medicines reviewed and adjusted.  Your PCP may decide to repeat certain labs or tests as needed. Do not drive, operate heavy machinery, perform activities at heights, swimming or participation in water activities or provide baby sitting services if your were admitted for syncope or siezures until you have seen by Primary MD or a Neurologist and advised to do so again. [place]  Controlled Substance Reporting System database was reviewed. Do not drive, operate heavy machinery, perform activities at heights, swim, participate in water activities or provide baby-sitting services while on medications for pain, sleep and mood until your outpatient physician has reevaluated you and advised to do so again.  You are strongly recommended to comply with the dose, frequency and duration of prescribed medications. Activity: As tolerated with Full fall precautions use walker/cane & assistance as needed Avoid using any recreational substances like cigarette, tobacco, alcohol, or non-prescribed drug. If you experience worsening of your admission symptoms, develop shortness of breath, life threatening emergency, suicidal or homicidal thoughts you must seek medical attention  immediately by calling 911 or calling your MD immediately  if symptoms less severe. You must read complete instructions/literature along with all the possible adverse reactions/side effects for all the medicines you take and that have been prescribed to you. Take any new medicine only after you have completely understood and accepted all the possible adverse reactions/side effects.  Wear Seat belts while driving. You were cared for by a hospitalist during your hospital stay. If you have any questions about your discharge medications or the care you received while you were in the hospital after you are discharged, you can call the unit and ask to speak with the hospitalist or the covering physician. Once you are discharged, your primary care physician will handle any further medical issues. Please note that NO REFILLS for any discharge medications will be authorized once you are discharged, as it is imperative that you return to your primary care physician (or establish a relationship with a primary care physician if you do not have one).   Increase activity slowly   Complete by: As directed        Discharge Exam:   Vitals:   04/07/24 1110 04/07/24 1115 04/07/24 1120 04/07/24 1130  BP: (!) 96/45 108/88 112/73 112/73  Pulse: 79 87 68 62  Resp: 18 (!) 21 14 (!) 8  Temp:   98.6 F (37 C)   TempSrc:   Oral   SpO2: 100% 98% 100% 100%  Weight:      Height:        Body mass index is 25.24 kg/m.  General exam: Pleasant, young female.  Not in distress Skin: No rashes, lesions or ulcers. HEENT: Atraumatic, normocephalic, no obvious bleeding Lungs: Clear to auscultation bilaterally,  CVS: S1, S2, no murmur,   GI/Abd: Soft, nontender, nondistended, bowel sound present,   CNS: Alert, awake, oriented x 3 Psychiatry: Mood appropriate Extremities: No pedal edema, no calf tenderness,    The results of significant diagnostics  from this hospitalization (including imaging, microbiology, ancillary  and laboratory) are listed below for reference.    Procedures and Diagnostic Studies:   CT ANGIO ABDOMEN PELVIS  W & WO CONTRAST Result Date: 04/06/2024 EXAM: CTA ABDOMEN AND PELVIS WITHOUT AND WITH CONTRAST 04/06/2024 02:34:47 AM TECHNIQUE: CTA images of the abdomen and pelvis without and with intravenous contrast. 75 mL of iohexol  (OMNIPAQUE ) 350 MG/ML injection was administered. Three-dimensional MIP/volume rendered formations were performed. Automated exposure control, iterative reconstruction, and/or weight based adjustment of the mA/kV was utilized to reduce the radiation dose to as low as reasonably achievable. COMPARISON: 07/11/2023 CLINICAL HISTORY: Lower GI bleed FINDINGS: VASCULATURE: No acute finding. AORTA: No acute finding. No abdominal aortic aneurysm. No dissection. CELIAC TRUNK: No acute finding. No occlusion or significant stenosis. SUPERIOR MESENTERIC ARTERY: No acute finding. No occlusion or significant stenosis. RENAL ARTERIES: No acute finding. No occlusion or significant stenosis. ILIAC ARTERIES: No acute finding. No occlusion or significant stenosis. LIVER: The liver is unremarkable. GALLBLADDER AND BILE DUCTS: Gallbladder is unremarkable. No biliary ductal dilatation. SPLEEN: The spleen is unremarkable. PANCREAS: The pancreas is unremarkable. ADRENAL GLANDS: Bilateral adrenal glands demonstrate no acute abnormality. KIDNEYS, URETERS AND BLADDER: No stones in the kidneys or ureters. No hydronephrosis. No perinephric or periureteral stranding. Urinary bladder is unremarkable. GI AND BOWEL: Stomach and duodenal sweep demonstrate no acute abnormality. Normal appendix. No evidence of acute GI bleeding. Mild colonic wall thickening of the descending colon. Trace adjacent stranding about the colon at the splenic flexure. The sigmoid colon is decompressed. There is no bowel obstruction. No abnormal bowel wall thickening or distension. REPRODUCTIVE: Reproductive organs are unremarkable.  PERITONEUM AND RETROPERITONEUM: Small volume low density free fluid in the pelvis. No ascites or free air. LUNG BASE: No acute abnormality. LYMPH NODES: No lymphadenopathy. BONES AND SOFT TISSUES: Fat containing periumbilical hernia. No acute abnormality of the bones. IMPRESSION: 1. Mild descending colitis. No evidence of acute GI bleeding. Electronically signed by: Norman Gatlin MD 04/06/2024 02:57 AM EST RP Workstation: [id number]     Labs:   Basic Metabolic Panel: Recent Labs  Lab 04/05/24 2248 04/07/24 0002  NA 136 138  K 3.5 3.6  CL 104 106  CO2 24 26  GLUCOSE 111* 95  BUN 11 <5*  CREATININE 0.68 0.73  CALCIUM 8.2* 8.8*   GFR Estimated Creatinine Clearance: 83.8 mL/min (by C-G formula based on SCr of 0.73 mg/dL). Liver Function Tests: Recent Labs  Lab 04/05/24 2248  AST 16  ALT 11  ALKPHOS 37*  BILITOT 0.5  PROT 6.0*  ALBUMIN 3.6   Recent Labs  Lab 04/05/24 2248  LIPASE 42   No results for input(s): AMMONIA in the last 168 hours. Coagulation profile No results for input(s): INR, PROTIME in the last 168 hours.  CBC: Recent Labs  Lab 04/05/24 2248 04/06/24 0824 04/06/24 2112 04/07/24 0002 04/07/24 0758  WBC 6.2 8.1 7.3 6.3 5.7  HGB 5.0* 7.8* 7.9* 8.1* 8.2*  HCT 20.2* 27.8* 27.8* 27.8* 28.5*  MCV 59.1* 66.5* 65.1* 64.7* 64.8*  PLT 639* 538* 530* 528* 529*   Cardiac Enzymes: No results for input(s): CKTOTAL, CKMB, CKMBINDEX, TROPONINI in the last 168 hours. BNP: Invalid input(s): POCBNP CBG: No results for input(s): GLUCAP in the last 168 hours. D-Dimer No results for input(s): DDIMER in the last 72 hours. Hgb A1c No results for input(s): HGBA1C in the last 72 hours. Lipid Profile No results for input(s): CHOL, HDL, LDLCALC, TRIG, CHOLHDL, LDLDIRECT in the last 72 hours. Thyroid   function studies No results for input(s): TSH, T4TOTAL, T3FREE, THYROIDAB in the last 72 hours.  Invalid input(s):  FREET3 Anemia work up [employer]    04/06/24 0824 04/06/24 0830  VITAMINB12 <150*  --   FOLATE  --  10.1  FERRITIN 1*  --   TIBC 479*  --   IRON 19*  --   RETICCTPCT  --  0.6   Microbiology No results found for this or any previous visit (from the past 240 hours).  Time coordinating discharge: 45 minutes  Signed: Kaylanie Capili  [facility] 04/07/2024, 2:48 PM

## 2024-04-07 NOTE — Plan of Care (Signed)
   Problem: Health Behavior/Discharge Planning: Goal: Ability to manage health-related needs will improve Outcome: Progressing

## 2024-04-07 NOTE — Op Note (Addendum)
 [facility] Patient Name: Maureen Hensley Procedure Date : 04/07/2024 MRN: [id number] Attending MD: Gustav ALONSO Mcgee , MD, [id number] Date of Birth: 30-Apr-1986 CSN: [id number] Age: 38 Admit Type: Inpatient Procedure:                Colonoscopy Indications:              Evaluation of unexplained GI bleeding presenting                            with Hematochezia, Unexplained iron deficiency                            anemia Providers:                Gustav ALONSO Mcgee, MD, Olam Riedel, RN, Coye Bade, Technician Referring MD:              Medicines:                Monitored Anesthesia Care Complications:            No immediate complications. Estimated Blood Loss:     Estimated blood loss: none. Procedure:                Pre-Anesthesia Assessment:                           - Prior to the procedure, a History and Physical                            was performed, and patient medications and                            allergies were reviewed. The patient's tolerance of                            previous anesthesia was also reviewed. The risks                            and benefits of the procedure and the sedation                            options and risks were discussed with the patient.                            All questions were answered, and informed consent                            was obtained. Prior Anticoagulants: The patient has                            taken no anticoagulant or antiplatelet agents. ASA                            Grade Assessment: II -  A patient with mild systemic                            disease. After reviewing the risks and benefits,                            the patient was deemed in satisfactory condition to                            undergo the procedure.                           After obtaining informed consent, the colonoscope                            was passed under direct vision.  Throughout the                            procedure, the patient's blood pressure, pulse, and                            oxygen saturations were monitored continuously. The                            [id number]-HQ190L ([id number]) Olympus colonoscope was                            introduced through the anus and advanced to the the                            cecum, identified by appendiceal orifice and                            ileocecal valve. The colonoscopy was performed                            without difficulty. The patient tolerated the                            procedure well. The quality of the bowel                            preparation was good. The terminal ileum, ileocecal                            valve, appendiceal orifice, and rectum were                            photographed. Scope In: 10:44:18 AM Scope Out: 10:56:16 AM Scope Withdrawal Time: 0 hours 7 minutes 28 seconds  Total Procedure Duration: 0 hours 11 minutes 58 seconds  Findings:      Hemorrhoids were found on perianal exam.      Bleeding internal hemorrhoids were found during retroflexion. Stigmata       of recent bleed, heme on left lateral  column of hemorrhoid. The       hemorrhoids were medium-sized.      The exam was otherwise without abnormality.      The terminal ileum appeared normal. Impression:               - Hemorrhoids found on perianal exam.                           - Bleeding internal hemorrhoids.                           - The examination was otherwise normal.                           - The examined portion of the ileum was normal.                           - No specimens collected. Recommendation:           - Resume previous diet.                           - Continue present medications.                           - Repeat colonoscopy in 10 years for surveillance.                           - Return to GI clinic at the next available                            appointment for hemorrhoid band  ligation with                            Dr.Kaiyah Eber                           - Continue Oral Iron                           - Miralax 1 capful daily to prevent constipation                           - Hydrocortisone suppositoy daily at bedtime X 7                            days                           - Inpatient GI signing off, please call with any                            questions. Procedure Code(s):        --- Professional ---                           [phone], Colonoscopy, flexible; diagnostic, including  collection of specimen(s) by brushing or washing,                            when performed (separate procedure) Diagnosis Code(s):        --- Professional ---                           K64.8, Other hemorrhoids                           K92.1, Melena (includes Hematochezia)                           D50.9, Iron deficiency anemia, unspecified CPT copyright 2022 American Medical Association. All rights reserved. The codes documented in this report are preliminary and upon coder review may  be revised to meet current compliance requirements. Tikesha Mort V. Adama Ivins, MD 04/07/2024 11:11:04 AM This report has been signed electronically. Number of Addenda: 0

## 2024-04-08 LAB — SURGICAL PATHOLOGY

## 2024-04-11 ENCOUNTER — Encounter (HOSPITAL_COMMUNITY): Payer: Self-pay | Admitting: Gastroenterology

## 2024-04-11 ENCOUNTER — Telehealth (HOSPITAL_COMMUNITY): Payer: Self-pay | Admitting: Pharmacy Technician

## 2024-04-11 ENCOUNTER — Telehealth (HOSPITAL_COMMUNITY): Payer: Self-pay | Admitting: Pharmacist

## 2024-04-11 DIAGNOSIS — D509 Iron deficiency anemia, unspecified: Secondary | ICD-10-CM | POA: Insufficient documentation

## 2024-04-11 NOTE — Telephone Encounter (Signed)
 Auth Submission: NO AUTH NEEDED Site of care: [facility] Payer: [place] [employer] Medication & CPT/J Code(s) submitted: Venofer  (Iron  Sucrose) J1756 Diagnosis Code: D50.9, D62 Route of submission (phone, fax, portal):  Phone # Fax # Auth type: Buy/Bill HB Units/visits requested: 300MG  X 3 DOSES Reference number:  Approval from: 04/11/2024 to 07/10/24    Dagoberto Armour, CPhT [facility] Phone: [phone] 04/11/2024

## 2024-04-11 NOTE — Telephone Encounter (Signed)
 Patient referred to infusion pharmacy team for ambulatory infusion of IV iron .  Insurance - [place] Medicaid Site of care - Site of care: [facility] Dx code - D62/D50.9 IV Iron  Therapy - Venofer  300mg  x 3 Infusion appointments - Scheduling team will schedule patient as soon as possible.   Sherry Pennant, PharmD, MPH, BCPS, CPP Clinical Pharmacist

## 2024-04-19 ENCOUNTER — Ambulatory Visit (HOSPITAL_BASED_OUTPATIENT_CLINIC_OR_DEPARTMENT_OTHER)

## 2024-04-19 ENCOUNTER — Encounter (HOSPITAL_BASED_OUTPATIENT_CLINIC_OR_DEPARTMENT_OTHER): Payer: Self-pay

## 2024-04-19 VITALS — BP 111/74 | HR 75 | Ht 62.0 in | Wt 137.0 lb

## 2024-04-19 DIAGNOSIS — Z7689 Persons encountering health services in other specified circumstances: Secondary | ICD-10-CM | POA: Diagnosis not present

## 2024-04-19 DIAGNOSIS — K625 Hemorrhage of anus and rectum: Secondary | ICD-10-CM

## 2024-04-19 NOTE — Progress Notes (Signed)
 New Patient Office Visit  Subjective:   Maureen Hensley [birth year] 04/19/2024  Chief Complaint  Patient presents with   New Patient (Initial Visit)    Patient is here to get established with the practice. States she has been bleeding when when she has a bowel movement. Denies any other concerns for today's visit.     HPI: Maureen Hensley presents today to establish care at Primary Care and Sports Medicine at [facility]. Introduced to publishing rights manager role and practice setting with verbalized understanding by patient.  All questions answered.   Last PCP: unknown Last annual physical: unknown Concerns: See below   GI Beeding: Pt states since leaving the hospital, she has continued to have rectal bleeding. States when she uses the bathroom there is bright red blood noted in the toilet bowl. States water is red. Denies any bleeding outside of when she uses the bathroom. States they did a CT, endoscopy and a colonoscopy and was told those were negative. States she was told she was given a referral for iron  infusions and diagnosed with hemorrhoid.  The following portions of the patient's history were reviewed and updated as appropriate: past medical history, past surgical history, family history, social history, allergies, medications, and problem list.   Patient Active Problem List   Diagnosis Date Noted   Iron  deficiency anemia, unspecified 04/11/2024   Rectal bleeding 04/07/2024   Gastritis and gastroduodenitis 04/07/2024   Lower GI bleeding 04/06/2024   Symptomatic anemia 04/06/2024   Pain of upper abdomen 04/06/2024   Chest pain 07/04/2023   Thrombocytosis 07/04/2023   Acute on chronic blood loss anemia 06/30/2018   Iron  deficiency anemia 06/30/2018   Menorrhagia 06/30/2018   Viral gastroenteritis 06/30/2018   HA (headache) 06/30/2018   Past Medical History:  Diagnosis Date   Anemia    Past Surgical History:  Procedure Laterality Date   CESAREAN  SECTION     3   COLONOSCOPY N/A 04/07/2024   Procedure: COLONOSCOPY;  Surgeon: Shila Gustav GAILS, MD;  Location: [facility] ENDOSCOPY;  Service: Gastroenterology;  Laterality: N/A;   ECTOPIC PREGNANCY SURGERY     ESOPHAGOGASTRODUODENOSCOPY N/A 04/07/2024   Procedure: EGD (ESOPHAGOGASTRODUODENOSCOPY);  Surgeon: Nandigam, Kavitha V, MD;  Location: [facility] ENDOSCOPY;  Service: Gastroenterology;  Laterality: N/A;   History reviewed. No pertinent family history. Social History   Socioeconomic History   Marital status: Single    Spouse name: Not on file   Number of children: Not on file   Years of education: Not on file   Highest education level: Not on file  Occupational History   Not on file  Tobacco Use   Smoking status: Former    Types: Cigarettes   Smokeless tobacco: Never  Vaping Use   Vaping status: Never Used  Substance and Sexual Activity   Alcohol use: Not Currently   Drug use: Not Currently   Sexual activity: Yes    Birth control/protection: None  Other Topics Concern   Not on file  Social History Narrative   Not on file   Social Drivers of Health   Tobacco Use: Medium Risk (04/19/2024)   Patient History    Smoking Tobacco Use: Former    Smokeless Tobacco Use: Never    Passive Exposure: Not on file  Financial Resource Strain: Low Risk (04/19/2024)   Overall Financial Resource Strain (CARDIA)    Difficulty of Paying Living Expenses: Not hard at all  Food Insecurity: Food Insecurity Present (04/06/2024)   Epic  Worried About Programme Researcher, Broadcasting/film/video in the Last Year: Never true    Ran Out of Food in the Last Year: Sometimes true  Transportation Needs: No Transportation Needs (04/06/2024)   Epic    Lack of Transportation (Medical): No    Lack of Transportation (Non-Medical): No  Physical Activity: Inactive (04/19/2024)   Exercise Vital Sign    Days of Exercise per Week: 0 days    Minutes of Exercise per Session: 0 min  Stress: Stress Concern Present (04/19/2024)   [employer] of Occupational Health - Occupational Stress Questionnaire    Feeling of Stress: To some extent  Social Connections: Moderately Isolated (07/05/2023)   Social Connection and Isolation Panel    Frequency of Communication with Friends and Family: More than three times a week    Frequency of Social Gatherings with Friends and Family: More than three times a week    Attends Religious Services: Never    Database Administrator or Organizations: Yes    Attends Banker Meetings: Never    Marital Status: Separated  Intimate Partner Violence: Not At Risk (04/06/2024)   Epic    Fear of Current or Ex-Partner: No    Emotionally Abused: No    Physically Abused: No    Sexually Abused: No  Depression (PHQ2-9): Low Risk (04/19/2024)   Depression (PHQ2-9)    PHQ-2 Score: 0  Alcohol Screen: Low Risk (04/19/2024)   Alcohol Screen    Last Alcohol Screening Score (AUDIT): 0  Housing: Low Risk (04/06/2024)   Epic    Unable to Pay for Housing in the Last Year: No    Number of Times Moved in the Last Year: 0    Homeless in the Last Year: No  Utilities: Not At Risk (04/06/2024)   Epic    Threatened with loss of utilities: No  Health Literacy: Not on file   Outpatient Medications Prior to Visit  Medication Sig Dispense Refill   ferrous sulfate  325 (65 FE) MG tablet Take 1 tablet (325 mg total) by mouth daily with breakfast. 90 tablet 0   polyethylene glycol powder (GLYCOLAX /MIRALAX ) 17 GM/SCOOP powder Take 17 g by mouth daily as needed for moderate constipation. Dissolve 1 capful (17g) in 4-8 ounces of liquid and take by mouth daily. 238 g 0   hydrocortisone  (ANUSOL -HC) 25 MG suppository Place 1 suppository (25 mg total) rectally at bedtime. (Patient not taking: Reported on 04/19/2024) 12 suppository 0   cyanocobalamin  100 MCG tablet Take 1 tablet (100 mcg total) by mouth daily. (Patient not taking: Reported on 04/19/2024) 90 tablet 0   No facility-administered medications prior to  visit.   Allergies[1]  ROS: A complete ROS was performed with pertinent positives/negatives noted in the HPI. The remainder of the ROS are negative.   Objective:   Today's Vitals   04/19/24 1302  BP: 111/74  Pulse: 75  SpO2: 100%  Weight: 137 lb (62.1 kg)  Height: 5' 2 (1.575 m)    GENERAL: Well-appearing, in NAD. Well nourished. SKIN: Pink, warm and dry. No rash, lesion, ulceration, or ecchymoses. RESPIRATORY: Chest wall symmetrical. Respirations even and non-labored. Breath sounds clear to auscultation bilaterally.  CARDIAC: S1, S2 present, regular rate and rhythm without murmur or gallops. Peripheral pulses 2+ bilaterally.  MSK: Muscle tone and strength appropriate for age. Joints w/o tenderness, redness, or swelling.  NEUROLOGIC: No motor or sensory deficits. Steady, even gait. C2-C12 intact.  PSYCH/MENTAL STATUS: Alert, oriented x 3. Cooperative, appropriate mood and  affect.    Health Maintenance Due  Topic Date Due   Hepatitis C Screening  Never done   DTaP/Tdap/Td (1 - Tdap) Never done   Hepatitis B Vaccines 19-59 Average Risk (1 of 3 - 19+ 3-dose series) Never done   HPV VACCINES (1 - 3-dose SCDM series) Never done   Cervical Cancer Screening (HPV/Pap Cotest)  Never done   Influenza Vaccine  Never done   COVID-19 Vaccine (1 - 2025-26 season) Never done    No results found for any visits on 04/19/24.     Assessment & Plan:  1. Encounter to establish care with new doctor (Primary) Discussed role of NP and expectations of the Primary Care Clinic. Discussed medical, surgical, and family history.    2. Rectal bleeding Discussed red flag symptoms and when to follow up with PCP or ER. Discussed signs and symptoms of low iron . Discussed referral for GI as they can further manage the bleeding hemorrhoid. Discussed ordering lab work today to make sure her levels are stable.  - CBC with Differential/Platelet - Ambulatory referral to Gastroenterology    Patient to  reach out to office if new, worrisome, or unresolved symptoms arise or if no improvement in patient's condition. Patient verbalized understanding and is agreeable to treatment plan. All questions answered to patient's satisfaction.    Return in about 3 weeks (around 05/10/2024) for annual physical (fasting labs).    Lauraine Almarie Angus DNP, FNP-C      [1] No Known Allergies

## 2024-04-19 NOTE — Patient Instructions (Signed)
 If bleeding becomes uncontrolled, you become dizzy, lightheaded, or short of breath please seek Emergency Care.

## 2024-04-20 ENCOUNTER — Ambulatory Visit (HOSPITAL_BASED_OUTPATIENT_CLINIC_OR_DEPARTMENT_OTHER): Payer: Self-pay

## 2024-04-20 DIAGNOSIS — D509 Iron deficiency anemia, unspecified: Secondary | ICD-10-CM

## 2024-04-20 LAB — CBC WITH DIFFERENTIAL/PLATELET
Basophils Absolute: 0.1 x10E3/uL (ref 0.0–0.2)
Basos: 1 %
EOS (ABSOLUTE): 0.1 x10E3/uL (ref 0.0–0.4)
Eos: 1 %
Hematocrit: 30.5 % — ABNORMAL LOW (ref 34.0–46.6)
Hemoglobin: 8.6 g/dL — ABNORMAL LOW (ref 11.1–15.9)
Immature Grans (Abs): 0 x10E3/uL (ref 0.0–0.1)
Immature Granulocytes: 0 %
Lymphocytes Absolute: 1.9 x10E3/uL (ref 0.7–3.1)
Lymphs: 33 %
MCH: 19.3 pg — ABNORMAL LOW (ref 26.6–33.0)
MCHC: 28.2 g/dL — ABNORMAL LOW (ref 31.5–35.7)
MCV: 69 fL — ABNORMAL LOW (ref 79–97)
Monocytes Absolute: 0.5 x10E3/uL (ref 0.1–0.9)
Monocytes: 8 %
Neutrophils Absolute: 3.2 x10E3/uL (ref 1.4–7.0)
Neutrophils: 57 %
Platelets: 83 x10E3/uL — CL (ref 150–450)
RBC: 4.45 x10E6/uL (ref 3.77–5.28)
RDW: 30.8 % — ABNORMAL HIGH (ref 11.7–15.4)
WBC: 5.7 x10E3/uL (ref 3.4–10.8)

## 2024-04-20 NOTE — Progress Notes (Signed)
 Maureen Hensley, Your hemoglobin is stable which is positive, however your platelets are much lower than they were 2 weeks ago. I am placing a referral for hematology for you to be seen for portential infusions. If you experience continued uncontrolled rectal bleeding, dizziness, shortness of breath or chest pain. Please go to the ER.  Lauraine Norris

## 2024-04-25 ENCOUNTER — Inpatient Hospital Stay

## 2024-04-25 ENCOUNTER — Encounter: Payer: Self-pay | Admitting: Internal Medicine

## 2024-04-25 ENCOUNTER — Inpatient Hospital Stay: Attending: Hematology and Oncology | Admitting: Hematology and Oncology

## 2024-04-25 VITALS — BP 111/78 | HR 88 | Temp 98.3°F | Resp 16 | Ht 62.0 in | Wt 136.0 lb

## 2024-04-25 DIAGNOSIS — N920 Excessive and frequent menstruation with regular cycle: Secondary | ICD-10-CM | POA: Insufficient documentation

## 2024-04-25 DIAGNOSIS — D500 Iron deficiency anemia secondary to blood loss (chronic): Secondary | ICD-10-CM | POA: Insufficient documentation

## 2024-04-25 DIAGNOSIS — E538 Deficiency of other specified B group vitamins: Secondary | ICD-10-CM | POA: Insufficient documentation

## 2024-04-25 LAB — APTT: aPTT: 28 s (ref 24–36)

## 2024-04-25 LAB — IRON AND IRON BINDING CAPACITY (CC-WL,HP ONLY)
Iron: 13 ug/dL — ABNORMAL LOW (ref 28–170)
Saturation Ratios: 3 % — ABNORMAL LOW (ref 10.4–31.8)
TIBC: 470 ug/dL — ABNORMAL HIGH (ref 250–450)
UIBC: 457 ug/dL

## 2024-04-25 LAB — CMP (CANCER CENTER ONLY)
ALT: 12 U/L (ref 0–44)
AST: 17 U/L (ref 15–41)
Albumin: 4.4 g/dL (ref 3.5–5.0)
Alkaline Phosphatase: 53 U/L (ref 38–126)
Anion gap: 9 (ref 5–15)
BUN: 9 mg/dL (ref 6–20)
CO2: 24 mmol/L (ref 22–32)
Calcium: 8.8 mg/dL — ABNORMAL LOW (ref 8.9–10.3)
Chloride: 104 mmol/L (ref 98–111)
Creatinine: 0.68 mg/dL (ref 0.44–1.00)
GFR, Estimated: >60 mL/min
Glucose, Bld: 93 mg/dL (ref 70–99)
Potassium: 4 mmol/L (ref 3.5–5.1)
Sodium: 137 mmol/L (ref 135–145)
Total Bilirubin: 0.6 mg/dL (ref 0.0–1.2)
Total Protein: 7.2 g/dL (ref 6.5–8.1)

## 2024-04-25 LAB — CBC WITH DIFFERENTIAL (CANCER CENTER ONLY)
Abs Immature Granulocytes: 0.02 K/uL (ref 0.00–0.07)
Basophils Absolute: 0 K/uL (ref 0.0–0.1)
Basophils Relative: 1 %
Eosinophils Absolute: 0.1 K/uL (ref 0.0–0.5)
Eosinophils Relative: 1 %
HCT: 28 % — ABNORMAL LOW (ref 36.0–46.0)
Hemoglobin: 8.1 g/dL — ABNORMAL LOW (ref 12.0–15.0)
Immature Granulocytes: 0 %
Lymphocytes Relative: 20 %
Lymphs Abs: 1.3 K/uL (ref 0.7–4.0)
MCH: 19.3 pg — ABNORMAL LOW (ref 26.0–34.0)
MCHC: 28.9 g/dL — ABNORMAL LOW (ref 30.0–36.0)
MCV: 66.8 fL — ABNORMAL LOW (ref 80.0–100.0)
Monocytes Absolute: 0.6 K/uL (ref 0.1–1.0)
Monocytes Relative: 9 %
Neutro Abs: 4.3 K/uL (ref 1.7–7.7)
Neutrophils Relative %: 69 %
Platelet Count: 189 K/uL (ref 150–400)
RBC: 4.19 MIL/uL (ref 3.87–5.11)
RDW: 32.5 % — ABNORMAL HIGH (ref 11.5–15.5)
WBC Count: 6.2 K/uL (ref 4.0–10.5)
nRBC: 0 % (ref 0.0–0.2)

## 2024-04-25 LAB — PROTIME-INR
INR: 1.1 (ref 0.8–1.2)
Prothrombin Time: 14.9 s (ref 11.4–15.2)

## 2024-04-25 LAB — VITAMIN B12: Vitamin B-12: 265 pg/mL (ref 180–914)

## 2024-04-25 LAB — RETIC PANEL
Immature Retic Fract: 23.4 % — ABNORMAL HIGH (ref 2.3–15.9)
RBC.: 4.2 MIL/uL (ref 3.87–5.11)
Retic Count, Absolute: 22.7 K/uL (ref 19.0–186.0)
Retic Ct Pct: 0.5 % (ref 0.4–3.1)
Reticulocyte Hemoglobin: 18.3 pg — ABNORMAL LOW

## 2024-04-25 LAB — FOLATE: Folate: 12.1 ng/mL

## 2024-04-25 LAB — FERRITIN: Ferritin: 7 ng/mL — ABNORMAL LOW (ref 11–307)

## 2024-04-25 MED ORDER — VITAMIN B-12 1000 MCG PO TABS: 1000.0000 ug | ORAL_TABLET | Freq: Every day | ORAL | 2 refills | Status: AC

## 2024-04-25 NOTE — Progress Notes (Signed)
 " [facility] Telephone:([phone]   [phone]  INITIAL CONSULT NOTE  Patient Care Team: Gari Lauraine BRAVO, FNP as PCP - General (Family Medicine)  Hematological/Oncological History # Iron  Deficiency Anemia 2/2 to GI Bleeding # Iron  Deficiency Anemia 2/2 to GYN Bleeding  04/05/2024-04/07/2024: Admitted to the hospital with vomiting and bloody diarrhea.  Suspected lower GI bleeding.  On admission hemoglobin 5.0 04/19/2024: White blood cell 5.7, hemoglobin 8.6, MCV 69, platelets 83. 04/25/2024: establish care with Dr. Federico   CHIEF COMPLAINTS/PURPOSE OF CONSULTATION:  Iron  Deficiency Anemia   HISTORY OF PRESENTING ILLNESS:  Maureen Hensley 38 y.o. female with no remarkable past medical history presents for evaluation of iron  deficiency anemia.   On review of the previous records Mrs. Kreamer was admitted to the hospital from 04/05/2024 until 04/07/2024.  She was having vomiting and a bloody diarrhea.  She underwent colonoscopy on 04/07/2024 which did reveal internal hemorrhoids but no other clear source of bleeding.  Labs on admission showed a hemoglobin of 5.0, MCV 59.1, and platelets of 639.  She received blood transfusion and additionally received 2 doses of IV Venofer  200 mg.  Due to concern for iron  deficiency anemia she was referred to hematology for further evaluation and management.   On exam today Mrs. Blassingame reports that she is continuing to have some bleeding from her hemorrhoids after discharge from the hospital.  She also reports she has very heavy menstrual cycles which are currently active.  She goes her about 1 pad per hour and they are completely soaked.  She reports that she is currently taking iron  pills and tolerating them well.  Additionally she has not yet picked up her vitamin B12 supplementation.  She reports that she does eat red meat a lot, typically every other day.  She has not been having any overt signs of bleeding or bruising elsewhere such as  nosebleeds, gum bleeding, or blood in the urine.  She reports she has had no recent infectious symptoms with fevers, chills, sweats.  She tolerated her IV iron  therapy well.  On further discussion she reports she has several cousins who are also anemic.  Her mom and dad are healthy and she has 2 brothers and 1 sister none of whom have any medical conditions she is aware of.  She has 3 children with a middle child having allergies.  She reports that she is a non-smoker and rarely drinks alcohol, only bout once a month.  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  She has had no issues with lightheadedness, dizziness, shortness of breath.  She reports her energy levels about a 7 out of 10.  Overall she feels great and has no questions concerns or complaints today.  Full 10 point ROS is otherwise negative.  MEDICAL HISTORY:  Past Medical History:  Diagnosis Date   Anemia     SURGICAL HISTORY: Past Surgical History:  Procedure Laterality Date   CESAREAN SECTION     3   COLONOSCOPY N/A 04/07/2024   Procedure: COLONOSCOPY;  Surgeon: Shila Gustav GAILS, MD;  Location: [facility] ENDOSCOPY;  Service: Gastroenterology;  Laterality: N/A;   ECTOPIC PREGNANCY SURGERY     ESOPHAGOGASTRODUODENOSCOPY N/A 04/07/2024   Procedure: EGD (ESOPHAGOGASTRODUODENOSCOPY);  Surgeon: Nandigam, Kavitha V, MD;  Location: [facility] ENDOSCOPY;  Service: Gastroenterology;  Laterality: N/A;    SOCIAL HISTORY: Social History   Socioeconomic History   Marital status: Single    Spouse name: Not on file  Number of children: Not on file   Years of education: Not on file   Highest education level: Not on file  Occupational History   Not on file  Tobacco Use   Smoking status: Former    Types: Cigarettes   Smokeless tobacco: Never  Vaping Use   Vaping status: Never Used  Substance and Sexual Activity   Alcohol use: Not Currently   Drug use: Not Currently   Sexual activity: Yes    Birth  control/protection: None  Other Topics Concern   Not on file  Social History Narrative   Not on file   Social Drivers of Health   Tobacco Use: Medium Risk (04/19/2024)   Patient History    Smoking Tobacco Use: Former    Smokeless Tobacco Use: Never    Passive Exposure: Not on file  Financial Resource Strain: Low Risk (04/19/2024)   Overall Financial Resource Strain (CARDIA)    Difficulty of Paying Living Expenses: Not hard at all  Food Insecurity: No Food Insecurity (04/25/2024)   Epic    Worried About Programme Researcher, Broadcasting/film/video in the Last Year: Never true    Ran Out of Food in the Last Year: Never true  Recent Concern: Food Insecurity - Food Insecurity Present (04/06/2024)   Epic    Worried About Programme Researcher, Broadcasting/film/video in the Last Year: Never true    Ran Out of Food in the Last Year: Sometimes true  Transportation Needs: No Transportation Needs (04/25/2024)   Epic    Lack of Transportation (Medical): No    Lack of Transportation (Non-Medical): No  Physical Activity: Inactive (04/19/2024)   Exercise Vital Sign    Days of Exercise per Week: 0 days    Minutes of Exercise per Session: 0 min  Stress: Stress Concern Present (04/19/2024)   [employer] of Occupational Health - Occupational Stress Questionnaire    Feeling of Stress: To some extent  Social Connections: Moderately Isolated (07/05/2023)   Social Connection and Isolation Panel    Frequency of Communication with Friends and Family: More than three times a week    Frequency of Social Gatherings with Friends and Family: More than three times a week    Attends Religious Services: Never    Database Administrator or Organizations: Yes    Attends Banker Meetings: Never    Marital Status: Separated  Intimate Partner Violence: Not At Risk (04/25/2024)   Epic    Fear of Current or Ex-Partner: No    Emotionally Abused: No    Physically Abused: No    Sexually Abused: No  Depression (PHQ2-9): Low Risk (04/25/2024)    Depression (PHQ2-9)    PHQ-2 Score: 0  Alcohol Screen: Low Risk (04/19/2024)   Alcohol Screen    Last Alcohol Screening Score (AUDIT): 0  Housing: Unknown (04/25/2024)   Epic    Unable to Pay for Housing in the Last Year: No    Number of Times Moved in the Last Year: Not on file    Homeless in the Last Year: No  Utilities: Not At Risk (04/25/2024)   Epic    Threatened with loss of utilities: No  Health Literacy: Not on file    FAMILY HISTORY: No family history on file.  ALLERGIES:  has no known allergies.  MEDICATIONS:  Current Outpatient Medications  Medication Sig Dispense Refill   cyanocobalamin  (VITAMIN B12) 1000 MCG tablet Take 1 tablet (1,000 mcg total) by mouth daily. 90 tablet 2   ferrous  sulfate 325 (65 FE) MG tablet Take 1 tablet (325 mg total) by mouth daily with breakfast. 90 tablet 0   polyethylene glycol powder (GLYCOLAX /MIRALAX ) 17 GM/SCOOP powder Take 17 g by mouth daily as needed for moderate constipation. Dissolve 1 capful (17g) in 4-8 ounces of liquid and take by mouth daily. 238 g 0   hydrocortisone  (ANUSOL -HC) 25 MG suppository Place 1 suppository (25 mg total) rectally at bedtime. (Patient not taking: Reported on 04/19/2024) 12 suppository 0   No current facility-administered medications for this visit.    REVIEW OF SYSTEMS:   Constitutional: ( - ) fevers, ( - )  chills , ( - ) night sweats Eyes: ( - ) blurriness of vision, ( - ) double vision, ( - ) watery eyes Ears, nose, mouth, throat, and face: ( - ) mucositis, ( - ) sore throat Respiratory: ( - ) cough, ( - ) dyspnea, ( - ) wheezes Cardiovascular: ( - ) palpitation, ( - ) chest discomfort, ( - ) lower extremity swelling Gastrointestinal:  ( - ) nausea, ( - ) heartburn, ( - ) change in bowel habits Skin: ( - ) abnormal skin rashes Lymphatics: ( - ) new lymphadenopathy, ( - ) easy bruising Neurological: ( - ) numbness, ( - ) tingling, ( - ) new weaknesses Behavioral/Psych: ( - ) mood change, ( - )  new changes  All other systems were reviewed with the patient and are negative.  PHYSICAL EXAMINATION:  Vitals:   04/25/24 1345  BP: 111/78  Pulse: 88  Resp: 16  Temp: 98.3 F (36.8 C)  SpO2: 100%   Filed Weights   04/25/24 1345  Weight: 136 lb (61.7 kg)    GENERAL: well appearing middle-age African-American female in NAD  SKIN: skin color, texture, turgor are normal, no rashes or significant lesions EYES: conjunctiva are pink and non-injected, sclera clear LUNGS: clear to auscultation and percussion with normal breathing effort HEART: regular rate & rhythm and no murmurs and no lower extremity edema Musculoskeletal: no cyanosis of digits and no clubbing  PSYCH: alert & oriented x 3, fluent speech NEURO: no focal motor/sensory deficits  LABORATORY DATA:  I have reviewed the data as listed    Latest Ref Rng & Units 04/25/2024    2:24 PM 04/19/2024    1:35 PM 04/07/2024    7:58 AM  CBC  WBC 4.0 - 10.5 K/uL 6.2  5.7  5.7   Hemoglobin 12.0 - 15.0 g/dL 8.1  8.6  8.2   Hematocrit 36.0 - 46.0 % 28.0  30.5  28.5   Platelets 150 - 400 K/uL 189  83  529        Latest Ref Rng & Units 04/25/2024    2:24 PM 04/07/2024   12:02 AM 04/05/2024   10:48 PM  CMP  Glucose 70 - 99 mg/dL 93  95  888   BUN 6 - 20 mg/dL 9  <5  11   Creatinine 0.44 - 1.00 mg/dL 9.31  9.26  9.31   Sodium 135 - 145 mmol/L 137  138  136   Potassium 3.5 - 5.1 mmol/L 4.0  3.6  3.5   Chloride 98 - 111 mmol/L 104  106  104   CO2 22 - 32 mmol/L 24  26  24    Calcium 8.9 - 10.3 mg/dL 8.8  8.8  8.2   Total Protein 6.5 - 8.1 g/dL 7.2   6.0   Total Bilirubin 0.0 - 1.2 mg/dL 0.6  0.5   Alkaline Phos 38 - 126 U/L 53   37   AST 15 - 41 U/L 17   16   ALT 0 - 44 U/L 12   11      ASSESSMENT & PLAN Lauraine LITTIE Birmingham 38 y.o. female with no remarkable past medical history presents for evaluation of iron  deficiency anemia.   After review of the labs, review of the records, and discussion with the patient the patients  findings are most consistent with IDA due to GI bleeding and GYN bleeding.   # Iron  Deficiency Anemia 2/2 to GI Bleeding  # Iron  Deficiency Anemia 2/2 to GYN Bleeding  -- Findings are consistent with iron  deficiency anemia secondary to GI bleeding.  --Encouraged patient to follow-up with GI/GYN for continued monitoring/evaluation.  --patient last had colonoscopy on 04/07/2024 and EGD on 04/07/2024.  --We will confirm iron  deficiency anemia by ordering iron  panel and ferritin as well as reticulocytes, CBC, and CMP --Continue ferrous sulfate  325 mg daily with a source of vitamin C --We will plan to proceed with IV iron  therapy in order to help bolster the patient's blood counts.  She had 2 doses of IV Venofer  200 mg while in the hospital.  --Plan for return to clinic in 4 to 6 weeks time after last dose of IV iron    Orders Placed This Encounter  Procedures   CBC with Differential (Cancer Center Only)    Standing Status:   Future    Number of Occurrences:   1    Expiration Date:   04/25/2025   CMP (Cancer Center only)    Standing Status:   Future    Number of Occurrences:   1    Expiration Date:   04/25/2025   Retic Panel    Standing Status:   Future    Number of Occurrences:   1    Expiration Date:   04/25/2025   Ferritin    Standing Status:   Future    Number of Occurrences:   1    Expiration Date:   04/25/2025   Iron  and Iron  Binding Capacity ([facility] only)    Standing Status:   Future    Number of Occurrences:   1    Expiration Date:   04/25/2025   Vitamin B12    Standing Status:   Future    Number of Occurrences:   1    Expiration Date:   04/25/2025   Folate, Serum    Standing Status:   Future    Number of Occurrences:   1    Expiration Date:   04/25/2025   Protime-INR    Standing Status:   Future    Number of Occurrences:   1    Expiration Date:   04/25/2025   APTT    Standing Status:   Future    Number of Occurrences:   1    Expiration Date:   04/25/2025     All questions were answered. The patient knows to call the clinic with any problems, questions or concerns.  A total of more than 60 minutes were spent on this encounter with face-to-face time and non-face-to-face time, including preparing to see the patient, ordering tests and/or medications, counseling the patient and coordination of care as outlined above.   Norleen IVAR Kidney, MD Department of Hematology/Oncology [facility] Phone: [phone] Pager: [phone] Email: [email or web address]  04/25/2024 4:52 PM  "

## 2024-04-26 ENCOUNTER — Encounter (HOSPITAL_COMMUNITY): Payer: Self-pay

## 2024-04-26 ENCOUNTER — Other Ambulatory Visit (HOSPITAL_COMMUNITY): Payer: Self-pay | Admitting: Hematology and Oncology

## 2024-04-26 ENCOUNTER — Telehealth (HOSPITAL_COMMUNITY): Payer: Self-pay

## 2024-04-26 ENCOUNTER — Telehealth: Payer: Self-pay | Admitting: Hematology and Oncology

## 2024-04-26 NOTE — Telephone Encounter (Signed)
 Auth Submission: NO AUTH NEEDED Site of care: Site of care: [facility] Payer: [place] [employer] Medication & CPT/J Code(s) submitted: Monoferric (Ferrci derisomaltose) [id number] Diagnosis Code: D62, D50.9 Route of submission (phone, fax, portal):  Phone # Fax # Auth type: Buy/Bill HB Units/visits requested: 1000mg  x 1 dose Reference number:  Approval from: 04/26/24 to 07/25/24

## 2024-04-26 NOTE — Telephone Encounter (Signed)
 I left voicemail for patient regarding scheduled appointments for injections, labs, and MD.

## 2024-05-09 ENCOUNTER — Inpatient Hospital Stay: Attending: Hematology and Oncology

## 2024-05-09 DIAGNOSIS — N920 Excessive and frequent menstruation with regular cycle: Secondary | ICD-10-CM | POA: Insufficient documentation

## 2024-05-09 DIAGNOSIS — D500 Iron deficiency anemia secondary to blood loss (chronic): Secondary | ICD-10-CM | POA: Diagnosis present

## 2024-05-09 DIAGNOSIS — E538 Deficiency of other specified B group vitamins: Secondary | ICD-10-CM | POA: Diagnosis present

## 2024-05-09 MED ORDER — CYANOCOBALAMIN 1000 MCG/ML IJ SOLN
1000.0000 ug | Freq: Once | INTRAMUSCULAR | Status: AC
  Administered 2024-05-09: 1000 ug via INTRAMUSCULAR
  Filled 2024-05-09: qty 1

## 2024-05-16 ENCOUNTER — Inpatient Hospital Stay

## 2024-05-16 DIAGNOSIS — E538 Deficiency of other specified B group vitamins: Secondary | ICD-10-CM

## 2024-05-16 MED ORDER — CYANOCOBALAMIN 1000 MCG/ML IJ SOLN
1000.0000 ug | Freq: Once | INTRAMUSCULAR | Status: AC
  Administered 2024-05-16: 1000 ug via INTRAMUSCULAR
  Filled 2024-05-16: qty 1

## 2024-05-20 ENCOUNTER — Ambulatory Visit (HOSPITAL_COMMUNITY)
Admission: RE | Admit: 2024-05-20 | Discharge: 2024-05-20 | Disposition: A | Discharge status: Home / Self Care | Source: Ambulatory Visit | Attending: Hematology and Oncology | Admitting: Hematology and Oncology

## 2024-05-20 VITALS — BP 106/71 | HR 92 | Temp 98.1°F | Resp 17

## 2024-05-20 DIAGNOSIS — D509 Iron deficiency anemia, unspecified: Secondary | ICD-10-CM | POA: Diagnosis not present

## 2024-05-20 DIAGNOSIS — D62 Acute posthemorrhagic anemia: Secondary | ICD-10-CM | POA: Insufficient documentation

## 2024-05-20 MED ORDER — METHYLPREDNISOLONE SODIUM SUCC 125 MG IJ SOLR
125.0000 mg | Freq: Once | INTRAMUSCULAR | Status: AC | PRN
  Administered 2024-05-20: 125 mg via INTRAVENOUS

## 2024-05-20 MED ORDER — SODIUM CHLORIDE 0.9 % IV SOLN
INTRAVENOUS | Status: AC
  Filled 2024-05-20: qty 10

## 2024-05-20 MED ORDER — SODIUM CHLORIDE 0.9 % IV SOLN
1000.0000 mg | Freq: Once | INTRAVENOUS | Status: AC
  Administered 2024-05-20: 1000 mg via INTRAVENOUS

## 2024-05-20 MED ORDER — SODIUM CHLORIDE 0.9 % IV SOLN: Freq: Once | INTRAVENOUS | Status: AC | PRN

## 2024-05-20 MED ORDER — DIPHENHYDRAMINE HCL 50 MG/ML IJ SOLN
INTRAMUSCULAR | Status: AC
  Filled 2024-05-20: qty 1

## 2024-05-20 MED ORDER — METHYLPREDNISOLONE SODIUM SUCC 125 MG IJ SOLR
INTRAMUSCULAR | Status: AC
  Filled 2024-05-20: qty 2

## 2024-05-20 MED ORDER — DIPHENHYDRAMINE HCL 50 MG/ML IJ SOLN
50.0000 mg | Freq: Once | INTRAMUSCULAR | Status: AC | PRN
  Administered 2024-05-20: 50 mg via INTRAVENOUS

## 2024-05-20 NOTE — Progress Notes (Signed)
 Orthostatic VS done and stable. NS infusion complete.  Pt stated she is feeling better and feels ready to go home, hives are barely visible now and pt denies dizziness.  Message sent to Dr Federico to update him on pt status.  I told the pt she should continue to feel better not worse, and if her symptoms returned to seek medical treatment and pt verbalized understanding.  Pt DC out of the dept via wheelchair and pt was taken home via [place] driver.

## 2024-05-20 NOTE — Progress Notes (Signed)
 Pt here today for monoferric . After it infused she developed hives on her arms and itching all over. We gave her 125mg  IV solumedrol, 50mg  IV benadryl , and NS 1 liter per reaction protocol VSS 125 79, 95 HR and 100 sats RA. I entered monoferric  as an allergy for her and notified Dr Federico. 30 miniutes after meds given hives began looking better.  Will continue to monitor

## 2024-05-23 ENCOUNTER — Emergency Department (HOSPITAL_COMMUNITY)

## 2024-05-23 ENCOUNTER — Inpatient Hospital Stay

## 2024-05-23 ENCOUNTER — Encounter (HOSPITAL_COMMUNITY): Payer: Self-pay

## 2024-05-23 ENCOUNTER — Other Ambulatory Visit: Payer: Self-pay

## 2024-05-23 ENCOUNTER — Observation Stay (HOSPITAL_COMMUNITY)
Admission: EM | Admit: 2024-05-23 | Discharge: 2024-05-25 | Disposition: A | Attending: Hospitalist | Admitting: Hospitalist

## 2024-05-23 DIAGNOSIS — N809 Endometriosis, unspecified: Secondary | ICD-10-CM | POA: Diagnosis not present

## 2024-05-23 DIAGNOSIS — M549 Dorsalgia, unspecified: Secondary | ICD-10-CM | POA: Diagnosis not present

## 2024-05-23 DIAGNOSIS — G9520 Unspecified cord compression: Secondary | ICD-10-CM

## 2024-05-23 DIAGNOSIS — R29898 Other symptoms and signs involving the musculoskeletal system: Secondary | ICD-10-CM | POA: Diagnosis present

## 2024-05-23 DIAGNOSIS — D62 Acute posthemorrhagic anemia: Secondary | ICD-10-CM | POA: Diagnosis not present

## 2024-05-23 DIAGNOSIS — Z79899 Other long term (current) drug therapy: Secondary | ICD-10-CM | POA: Insufficient documentation

## 2024-05-23 DIAGNOSIS — R531 Weakness: Principal | ICD-10-CM | POA: Insufficient documentation

## 2024-05-23 DIAGNOSIS — E876 Hypokalemia: Secondary | ICD-10-CM | POA: Diagnosis not present

## 2024-05-23 DIAGNOSIS — Z87891 Personal history of nicotine dependence: Secondary | ICD-10-CM | POA: Diagnosis not present

## 2024-05-23 DIAGNOSIS — E559 Vitamin D deficiency, unspecified: Secondary | ICD-10-CM | POA: Diagnosis not present

## 2024-05-23 DIAGNOSIS — G0491 Myelitis, unspecified: Secondary | ICD-10-CM | POA: Diagnosis not present

## 2024-05-23 DIAGNOSIS — N921 Excessive and frequent menstruation with irregular cycle: Secondary | ICD-10-CM | POA: Diagnosis not present

## 2024-05-23 LAB — DIFFERENTIAL
Abs Immature Granulocytes: 0.05 K/uL (ref 0.00–0.07)
Basophils Absolute: 0 K/uL (ref 0.0–0.1)
Basophils Relative: 1 %
Eosinophils Absolute: 0 K/uL (ref 0.0–0.5)
Eosinophils Relative: 1 %
Immature Granulocytes: 1 %
Lymphocytes Relative: 11 %
Lymphs Abs: 0.8 K/uL (ref 0.7–4.0)
Monocytes Absolute: 0.6 K/uL (ref 0.1–1.0)
Monocytes Relative: 9 %
Neutro Abs: 5.9 K/uL (ref 1.7–7.7)
Neutrophils Relative %: 77 %

## 2024-05-23 LAB — CBG MONITORING, ED: Glucose-Capillary: 96 mg/dL (ref 70–99)

## 2024-05-23 LAB — COMPREHENSIVE METABOLIC PANEL WITH GFR
ALT: 12 U/L (ref 0–44)
AST: 18 U/L (ref 15–41)
Albumin: 4.1 g/dL (ref 3.5–5.0)
Alkaline Phosphatase: 48 U/L (ref 38–126)
Anion gap: 11 (ref 5–15)
BUN: 8 mg/dL (ref 6–20)
CO2: 20 mmol/L — ABNORMAL LOW (ref 22–32)
Calcium: 8.7 mg/dL — ABNORMAL LOW (ref 8.9–10.3)
Chloride: 105 mmol/L (ref 98–111)
Creatinine, Ser: 0.57 mg/dL (ref 0.44–1.00)
GFR, Estimated: >60 mL/min
Glucose, Bld: 97 mg/dL (ref 70–99)
Potassium: 3.4 mmol/L — ABNORMAL LOW (ref 3.5–5.1)
Sodium: 137 mmol/L (ref 135–145)
Total Bilirubin: 0.5 mg/dL (ref 0.0–1.2)
Total Protein: 6.5 g/dL (ref 6.5–8.1)

## 2024-05-23 LAB — CBC
HCT: 24.5 % — ABNORMAL LOW (ref 36.0–46.0)
Hemoglobin: 7 g/dL — ABNORMAL LOW (ref 12.0–15.0)
MCH: 19.7 pg — ABNORMAL LOW (ref 26.0–34.0)
MCHC: 28.6 g/dL — ABNORMAL LOW (ref 30.0–36.0)
MCV: 69 fL — ABNORMAL LOW (ref 80.0–100.0)
Platelets: 140 K/uL — ABNORMAL LOW (ref 150–400)
RBC: 3.55 MIL/uL — ABNORMAL LOW (ref 3.87–5.11)
RDW: 26.7 % — ABNORMAL HIGH (ref 11.5–15.5)
WBC: 7.5 K/uL (ref 4.0–10.5)
nRBC: 0.5 % — ABNORMAL HIGH (ref 0.0–0.2)

## 2024-05-23 LAB — I-STAT CHEM 8, ED
BUN: 6 mg/dL (ref 6–20)
Calcium, Ion: 1.14 mmol/L — ABNORMAL LOW (ref 1.15–1.40)
Chloride: 104 mmol/L (ref 98–111)
Creatinine, Ser: 0.6 mg/dL (ref 0.44–1.00)
Glucose, Bld: 96 mg/dL (ref 70–99)
HCT: 25 % — ABNORMAL LOW (ref 36.0–46.0)
Hemoglobin: 8.5 g/dL — ABNORMAL LOW (ref 12.0–15.0)
Potassium: 3.3 mmol/L — ABNORMAL LOW (ref 3.5–5.1)
Sodium: 140 mmol/L (ref 135–145)
TCO2: 20 mmol/L — ABNORMAL LOW (ref 22–32)

## 2024-05-23 LAB — ETHANOL: Alcohol, Ethyl (B): <15 mg/dL

## 2024-05-23 LAB — APTT: aPTT: 29 s (ref 24–36)

## 2024-05-23 LAB — PROTIME-INR
INR: 1.1 (ref 0.8–1.2)
Prothrombin Time: 14.9 s (ref 11.4–15.2)

## 2024-05-23 MED ORDER — IOHEXOL 350 MG/ML SOLN
100.0000 mL | Freq: Once | INTRAVENOUS | Status: AC | PRN
  Administered 2024-05-23: 100 mL via INTRAVENOUS

## 2024-05-23 MED ORDER — ACETAMINOPHEN 500 MG PO TABS
1000.0000 mg | ORAL_TABLET | Freq: Three times a day (TID) | ORAL | Status: DC
  Administered 2024-05-23 – 2024-05-25 (×6): 1000 mg via ORAL
  Filled 2024-05-23 (×6): qty 2

## 2024-05-23 MED ORDER — GABAPENTIN 300 MG PO CAPS
300.0000 mg | ORAL_CAPSULE | Freq: Three times a day (TID) | ORAL | Status: DC
  Administered 2024-05-23 – 2024-05-25 (×6): 300 mg via ORAL
  Filled 2024-05-23 (×6): qty 1

## 2024-05-23 MED ORDER — SODIUM CHLORIDE 0.9% FLUSH
3.0000 mL | Freq: Once | INTRAVENOUS | Status: AC
  Administered 2024-05-23: 3 mL via INTRAVENOUS

## 2024-05-23 MED ORDER — IOHEXOL 350 MG/ML SOLN: 100.0000 mL | Freq: Once | INTRAVENOUS | Status: DC | PRN

## 2024-05-23 MED ORDER — OXYCODONE HCL 5 MG PO TABS
5.0000 mg | ORAL_TABLET | ORAL | Status: DC | PRN
  Administered 2024-05-24: 5 mg via ORAL
  Filled 2024-05-23: qty 1

## 2024-05-23 MED ORDER — METHOCARBAMOL 500 MG PO TABS
500.0000 mg | ORAL_TABLET | Freq: Three times a day (TID) | ORAL | Status: DC
  Administered 2024-05-23 – 2024-05-25 (×6): 500 mg via ORAL
  Filled 2024-05-23 (×6): qty 1

## 2024-05-23 MED ORDER — GADOBUTROL 1 MMOL/ML IV SOLN
6.0000 mL | Freq: Once | INTRAVENOUS | Status: AC | PRN
  Administered 2024-05-23: 6 mL via INTRAVENOUS

## 2024-05-23 MED ORDER — OXYCODONE-ACETAMINOPHEN 5-325 MG PO TABS
1.0000 | ORAL_TABLET | Freq: Once | ORAL | Status: AC
  Administered 2024-05-23: 1 via ORAL
  Filled 2024-05-23: qty 1

## 2024-05-23 MED ORDER — ENOXAPARIN SODIUM 40 MG/0.4ML IJ SOSY
40.0000 mg | PREFILLED_SYRINGE | INTRAMUSCULAR | Status: DC
  Administered 2024-05-23: 40 mg via SUBCUTANEOUS
  Filled 2024-05-23: qty 0.4

## 2024-05-23 MED ORDER — STROKE: EARLY STAGES OF RECOVERY BOOK
Freq: Once | Status: AC
  Filled 2024-05-23: qty 1

## 2024-05-23 NOTE — H&P (Signed)
 " History and Physical    Patient: Maureen Hensley:[id number] DOB: 1985-05-20 DOA: 05/23/2024 DOS: the patient was seen and examined on 05/23/2024 PCP: Gari Lauraine BRAVO, FNP  Patient coming from: Home  Chief Complaint:  Chief Complaint  Patient presents with   Code Stroke   HPI: Maureen Hensley is a 39 y.o. female with medical history significant of anemia 2/2 endometriosis/menorrhagia who p/w LLE weakness c/f acute stroke.  The patient reported experiencing sharp pain in the back and neck, which began at work. Initially, the patient was limping and noted sharp pain but attributed it to usual body aches. The pain worsened around late morning, reaching a point where the patient almost lost consciousness. At that time, the patient experienced numbness on the whole left side of the body. The numbness resolved after arriving to the ED. Currently, the pain persists in the back and neck, particularly on the left side. The patient mentioned a possible reaction to an iron  infusion received the previous Friday for her IDA, which caused itching. The patient has not experienced similar symptoms in the past and reports no history of strokes, seizures, or significant back injuries.  In the ED, pt AFVSS. Labs notable for Hb 7>8.5, which is baseline. MRI brain pending. EDP consulted neurology who requested medicine admission for CVA w/u.   Review of Systems: As mentioned in the history of present illness. All other systems reviewed and are negative. Past Medical History:  Diagnosis Date   Anemia    Past Surgical History:  Procedure Laterality Date   CESAREAN SECTION     3   COLONOSCOPY N/A 04/07/2024   Procedure: COLONOSCOPY;  Surgeon: Shila Gustav GAILS, MD;  Location[facility] ENDOSCOPY;  Service: Gastroenterology;  Laterality: N/A;   ECTOPIC PREGNANCY SURGERY     ESOPHAGOGASTRODUODENOSCOPY N/A 04/07/2024   Procedure: EGD (ESOPHAGOGASTRODUODENOSCOPY);  Surgeon: Nandigam, Kavitha V, MD;  Location: [facility]  ENDOSCOPY;  Service: Gastroenterology;  Laterality: N/A;   Social History:  reports that she has quit smoking. Her smoking use included cigarettes. She has never used smokeless tobacco. She reports that she does not currently use alcohol. She reports that she does not currently use drugs.  Allergies[1]  History reviewed. No pertinent family history.  Prior to Admission medications  Medication Sig Start Date End Date Taking? Authorizing Provider  Acetaminophen -Caff-Pyrilamine (MIDOL  COMPLETE PO) Take 2 tablets by mouth 2 (two) times daily as needed (abdomnial cramping, pain, back pain).   Yes [provider]  cyanocobalamin  (VITAMIN B12) 1000 MCG/ML injection Inject 1,000 mcg into the muscle every Monday.   Yes [provider]  ferrous sulfate  325 (65 FE) MG tablet Take 1 tablet (325 mg total) by mouth daily with breakfast. Patient taking differently: Take 325 mg by mouth daily. 04/07/24 04/07/25 Yes Dahal, Chapman, MD  polyethylene glycol powder (GLYCOLAX /MIRALAX ) 17 GM/SCOOP powder Take 17 g by mouth daily as needed for moderate constipation. Dissolve 1 capful (17g) in 4-8 ounces of liquid and take by mouth daily. 04/07/24  Yes Dahal, Chapman, MD  cyanocobalamin  (VITAMIN B12) 1000 MCG tablet Take 1 tablet (1,000 mcg total) by mouth daily. Patient not taking: Reported on 05/23/2024 04/25/24   Federico Norleen ONEIDA MADISON, MD  hydrocortisone  (ANUSOL -HC) 25 MG suppository Place 1 suppository (25 mg total) rectally at bedtime. Patient not taking: Reported on 04/19/2024 04/07/24   Arlice Chapman, MD    Physical Exam: Vitals:   05/23/24 1230  BP: 122/82  Pulse: 92  Resp: 13  Temp: 99.1 F (37.3  C)  TempSrc: Oral  SpO2: 100%   General: Alert, oriented x3, resting comfortably in no acute distress HEENT: EOMI, oropharynx clear, moist mucous membranes, hearing intact Neck: Trachea midline and no gross thyromegaly Respiratory: Lungs clear to auscultation bilaterally with normal respiratory  effort; no w/r/r Cardiovascular: Regular rate and rhythm w/o m/r/g Abdomen: Soft, nontender, nondistended. Positive bowel sounds MSK: No obvious joint deformities or swelling Skin: No obvious rashes or lesions Neurologic: Awake, alert, spontaneously moves all extremities, strength intact Psychiatric: Appropriate mood and affect, conversational and cooperative   Data Reviewed:  Lab Results  Component Value Date   WBC 7.5 05/23/2024   HGB 8.5 (L) 05/23/2024   HCT 25.0 (L) 05/23/2024   MCV 69.0 (L) 05/23/2024   PLT 140 (L) 05/23/2024   Lab Results  Component Value Date   GLUCOSE 96 05/23/2024   CALCIUM  8.7 (L) 05/23/2024   NA 140 05/23/2024   K 3.3 (L) 05/23/2024   CO2 20 (L) 05/23/2024   CL 104 05/23/2024   BUN 6 05/23/2024   CREATININE 0.60 05/23/2024   Lab Results  Component Value Date   ALT 12 05/23/2024   AST 18 05/23/2024   ALKPHOS 48 05/23/2024   BILITOT 0.5 05/23/2024   Lab Results  Component Value Date   INR 1.1 05/23/2024   INR 1.1 04/25/2024   Radiology: CT ANGIO HEAD NECK W WO CM W PERF (CODE STROKE) Result Date: 05/23/2024 EXAM: CTA Head and Neck with Perfusion 05/23/2024 12:23:13 PM TECHNIQUE: CTA of the head and neck was performed without and with the administration of 100 mL of iohexol  (OMNIPAQUE ) 350 MG/ML injection. 3D postprocessing with multiplanar reconstructions and MIPs was performed to evaluate the vascular anatomy. Cerebral perfusion analysis using computed tomography with contrast administration, including post-processing of parametric maps with determination of cerebral blood flow, cerebral blood volume, mean transit time and time-to-maximum. Automated exposure control, iterative reconstruction, and/or weight based adjustment of the mA/kV was utilized to reduce the radiation dose to as low as reasonably achievable. COMPARISON: CT of the head dated 05/23/2024 CLINICAL HISTORY: Neuro deficit, acute, stroke suspected. FINDINGS: CTA NECK: AORTIC ARCH  AND ARCH VESSELS: No dissection or arterial injury. No significant stenosis of the brachiocephalic or subclavian arteries. CERVICAL CAROTID ARTERIES: No dissection, arterial injury, or hemodynamically significant stenosis by NASCET criteria. CERVICAL VERTEBRAL ARTERIES: No dissection, arterial injury, or significant stenosis. LUNGS AND MEDIASTINUM: Unremarkable. SOFT TISSUES: No acute abnormality. BONES: No acute abnormality. CTA HEAD: ANTERIOR CIRCULATION: No significant stenosis of the internal carotid arteries. No significant stenosis of the anterior cerebral arteries. No significant stenosis of the middle cerebral arteries. No aneurysm. POSTERIOR CIRCULATION: No significant stenosis of the posterior cerebral arteries. No significant stenosis of the basilar artery. No significant stenosis of the vertebral arteries. No aneurysm. OTHER: No dural venous sinus thrombosis on this non-dedicated study. CT PERFUSION: EXAM QUALITY: Exam quality is adequate with diagnostic perfusion maps. No significant motion artifact. Appropriate arterial inflow and venous outflow curves. CORE INFARCT (CBF<30% volume): 0 mL TOTAL HYPOPERFUSION (Tmax>6s volume): 0 mL PENUMBRA: Mismatch volume: 0 mL Mismatch ratio: not applicable Location: not applicable The above findings were communicated to Dr. Jerri at 12:29 pm 05/23/2024. IMPRESSION: 1. No acute large vessel occlusion. 2. No hemodynamically significant stenosis or aneurysm in the head or neck vessels. 3. No evidence of ischemia by CT brain perfusion. 4. Above findings were communicated to Dr. Jerri at 12:29 PM on 05/23/2024. Electronically signed by: Evalene Coho MD 05/23/2024 12:30 PM EST RP Workstation: [id number]  CT HEAD CODE STROKE WO CONTRAST Result Date: 05/23/2024 EXAM: CT HEAD WITHOUT CONTRAST 05/23/2024 12:08:43 PM TECHNIQUE: CT of the head was performed without the administration of intravenous contrast. Automated exposure control, iterative reconstruction, and/or weight  based adjustment of the mA/kV was utilized to reduce the radiation dose to as low as reasonably achievable. COMPARISON: None available. CLINICAL HISTORY: Neuro deficit, acute, stroke suspected Neuro deficit, acute, stroke suspected Neuro deficit, acute, stroke suspected Neuro deficit, acute, stroke suspected Neuro deficit, acute, stroke suspected FINDINGS: BRAIN AND VENTRICLES: No acute hemorrhage. No evidence of acute infarct. No hydrocephalus. No extra-axial collection. No mass effect or midline shift. ORBITS: No acute abnormality. SINUSES: There is mucosal disease within the frontal and left ethmoid sinuses. There is also minimal mucosal disease within the left sphenoid sinus. SOFT TISSUES AND SKULL: No acute soft tissue abnormality. No skull fracture. [place] Stroke Program Early CT Score (ASPECTS): Ganglionic (caudate, IC, lentiform nucleus, insula, M1-M3): 7 Supraganglionic (M4-M6): 3 Total: 10 IMPRESSION: 1. No acute intracranial abnormality in the setting of suspected acute stroke. ASPECTS: 10. Findings communicated to Dr. Jerri at 12:15 PM 05/23/2024. 2. Mucosal disease within the frontal and left ethmoid sinuses, with minimal mucosal disease in the left sphenoid sinus. Electronically signed by: Evalene Coho MD 05/23/2024 12:17 PM EST RP Workstation: [id number]    Assessment and Plan: 51F h/o anemia 2/2 endometriosis/menorrhagia who p/w LLE weakness c/f acute stroke.  LLE weakness c/f stroke -Neuro following; appreciate eval/recs -PT/OT/SLP following; appreciate recs -Cardiac telemetry; consider 30d holter monitor at d/c if no arrhythmias captures -Allow permissive HTN (220/120 due to acute stroke) -Risk factor modification -Frequent neuro checks per protocol -F/u HgbA1c, fasting lipid panel -F/u TTE  Back pain -Multimodal pain control w/ tylenol , robaxin , gabapentin  and oxycodone  prn   Advance Care Planning:   Code Status: Prior   Consults: Neurology  Family Communication:  N/A  Severity of Illness: The appropriate patient status for this patient is INPATIENT. Inpatient status is judged to be reasonable and necessary in order to provide the required intensity of service to ensure the patient's safety. The patient's presenting symptoms, physical exam findings, and initial radiographic and laboratory data in the context of their chronic comorbidities is felt to place them at high risk for further clinical deterioration. Furthermore, it is not anticipated that the patient will be medically stable for discharge from the hospital within 2 midnights of admission.   * I certify that at the point of admission it is my clinical judgment that the patient will require inpatient hospital care spanning beyond 2 midnights from the point of admission due to high intensity of service, high risk for further deterioration and high frequency of surveillance required.*   ------- I spent 57 minutes reviewing previous notes, at the bedside counseling/discussing the treatment plan, and performing clinical documentation.  Author: Marsha Ada, MD 05/23/2024 2:42 PM  For on call review [email or web address].      [1]  Allergies Allergen Reactions   Monoferric  [Ferric Derisomaltose ] Hives and Itching   "

## 2024-05-23 NOTE — ED Triage Notes (Signed)
 Pt BIB [facility] from work for code stroke. Pt began having hip, neck, and back pain last night, when pt walked into work this AM, pt was reporting L sided weakness and pain. At 11am, pt reports L sided droop, slurred speech, blurry vision in L eye, and nausea. EMS gave 4mg  Zofran . All VSS per EMS.

## 2024-05-23 NOTE — ED Notes (Signed)
PT ambulated to the bathroom without assistance

## 2024-05-23 NOTE — ED Notes (Signed)
 Called and placed PT on monitor with CCMD

## 2024-05-23 NOTE — Code Documentation (Signed)
 Stroke Response Nurse Documentation Code Documentation  IYANNAH BLAKE is a 39 y.o. female arriving to [facility]  via [facility] on 05/23/2024 with past medical hx of anemia, GIB. On No antithrombotic. Code stroke was activated by EMS.   Patient from work where she was LKW at sometime yesterday and now complaining of left leg weakness and left sided pain.  Stroke team at the bedside on patient arrival. Labs drawn and patient cleared for CT by EDP Patient to CT with team. NIHSS 1, see documentation for details and code stroke times. Patient with left leg weakness on exam. The following imaging was completed:  CT Head, CTA, and CTP. Patient is not a candidate for IV Thrombolytic due to unclear LKW. Patient is not a candidate for IR due to no LVO on advanced imaging..   Care Plan:   No acute treatment/TIA alert: q2h x 12 hours NIHSS & VS, then q4h. NPO until swallow screen passed.   Bedside handoff with ED RN Camie.    Caeson Filippi Livengood  Stroke Response RN

## 2024-05-23 NOTE — Progress Notes (Signed)
 Stroke Neurology Consultation Note  Consult Requested by: Dr. Darra  Reason for Consult: code stroke  Consult Date: 05/23/24   The history was obtained from the pt.  During history and examination, all items were able to obtain unless otherwise noted.  History of Present Illness:  Maureen Hensley is a 39 y.o. 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 This morning, she noted neck pain and lower back pain. She continued to work. Around 10:30am, she had severe pain at neck, whole back and radiating to whole back and flank on the left. He limping also getting worse. EMS called. Pt sent to [facility] for code stroke. Feeling nausea  but no vomiting  LSN: 10:30pm TNK Given: No: out of window IR Thrombectomy? No, no LVO Modified Rankin Scale: 0-Completely asymptomatic and back to baseline post- stroke  Past Medical History:  Diagnosis Date   Anemia     Past Surgical History:  Procedure Laterality Date   CESAREAN SECTION     3   COLONOSCOPY N/A 04/07/2024   Procedure: COLONOSCOPY;  Surgeon: Shila Gustav GAILS, MD;  Location: [facility] ENDOSCOPY;  Service: Gastroenterology;  Laterality: N/A;   ECTOPIC PREGNANCY SURGERY     ESOPHAGOGASTRODUODENOSCOPY N/A 04/07/2024   Procedure: EGD (ESOPHAGOGASTRODUODENOSCOPY);  Surgeon: Nandigam, Kavitha V, MD;  Location: [facility] ENDOSCOPY;  Service: Gastroenterology;  Laterality: N/A;    History reviewed. No pertinent family history.  Social History:  reports that she has quit smoking. Her smoking use included cigarettes. She has never used smokeless tobacco. She reports that she does not currently use alcohol. She reports that she does not currently use drugs.  Allergies: Allergies[1]  Medications Ordered Prior to Encounter[2]  Review of Systems: A  full ROS was attempted today and was able to be performed.  Systems assessed include - Constitutional, Eyes, HENT, Respiratory, Cardiovascular, Gastrointestinal, Genitourinary, Integument/breast, Hematologic/lymphatic, Musculoskeletal, Neurological, Behavioral/Psych, Endocrine, Allergic/Immunologic - with pertinent responses as per HPI.  Physical Examination: Temp:  [99.1 F (37.3 C)] 99.1 F (37.3 C) (01/19 1230) Pulse Rate:  [92] 92 (01/19 1230) Resp:  [13] 13 (01/19 1230) BP: (122)/(82) 122/82 (01/19 1230) SpO2:  [100 %] 100 % (01/19 1230)  General - well nourished, well developed, in no apparent distress.    Ophthalmologic - fundi not visualized due to noncooperation.    Cardiovascular - regular rhythm and rate  Mental Status -  Level of arousal and orientation to time, place, and person were intact. Language including expression, naming, repetition, comprehension was Recent and remote memory were intact. Fund of Knowledge was assessed and was intact.  Cranial Nerves II - XII - II - Vision intact OU. III, IV, VI - Extraocular movements intact V - Facial sensation intact bilaterally. VII - Facial movement intact bilaterally. VIII - Hearing & vestibular intact bilaterally. X - Palate elevates symmetrically. XI - Chin turning & shoulder shrug intact bilaterally. XII - Tongue protrusion intact.  Motor Strength - The patients strength was normal in all extremities and pronator drift was absent except slight drift on the left leg. Pt said if without pain she does not feel the left leg weak  Motor Tone & Bulk - Muscle tone was assessed at the neck and appendages and was normal.  Bulk was normal and  fasciculations were absent.   Reflexes - The patients reflexes were normal in all extremities and she had no pathological reflexes.  Sensory - Light touch, temperature/pinprick were assessed and were normal, however, subjective pain at neck and whole back radiating to the left.     Coordination - The patient had normal movements in the hands and feet with no ataxia or dysmetria.  Tremor was absent.  Gait and Station - deferred  NIH Stroke Scale  Level Of Consciousness 0=Alert; keenly responsive 1=Arouse to minor stimulation 2=Requires repeated stimulation to arouse or movements to pain 3=postures or unresponsive 0  LOC Questions to Month and Age 87=Answers both questions correctly 1=Answers one question correctly or dysarthria/intubated/trauma/language barrier 2=Answers neither question correctly or aphasia 0  LOC Commands      -Open/Close eyes     -Open/close grip     -Pantomime commands if communication barrier 0=Performs both tasks correctly 1=Performs one task correctly 2=Performs neighter task correctly 0  Best Gaze     -Only assess horizontal gaze 0=Normal 1=Partial gaze palsy 2=Forced deviation, or total gaze paresis 0  Visual 0=No visual loss 1=Partial hemianopia 2=Complete hemianopia 3=Bilateral hemianopia (blind including cortical blindness) 0  Facial Palsy     -Use grimace if obtunded 0=Normal symmetrical movement 1=Minor paralysis (asymmetry) 2=Partial paralysis (lower face) 3=Complete paralysis (upper and lower face) 0  Motor  0=No drift for 10/5 seconds 1=Drift, but does not hit bed 2=Some antigravity effort, hits  bed 3=No effort against gravity, limb falls 4=No movement 0=Amputation/joint fusion Right Arm 0     Leg 0    Left Arm 0     Leg 1  Limb Ataxia     - FNT/HTS 0=Absent or does not understand or paralyzed or amputation/joint fusion 1=Present in one limb 2=Present in two limbs 0  Sensory 0=Normal 1=Mild to moderate sensory loss 2=Severe to total sensory loss or coma/unresponsive 0  Best Language 0=No aphasia, normal 1=Mild to moderate aphasia 2=Severe aphasia 3=Mute, global aphasia, or coma/unresponsive 0  Dysarthria 0=Normal 1=Mild to moderate 2=Severe, unintelligible or mute/anarthric 0=intubated/unable to test 0   Extinction/Neglect 0=No abnormality 1=visual/tactile/auditory/spatia/personal inattention/Extinction to bilateral simultaneous stimulation 2=Profound neglect/extinction more than 1 modality  0  Total   1     Data Reviewed: CT ANGIO HEAD NECK W WO CM W PERF (CODE STROKE) Result Date: 05/23/2024 EXAM: CTA Head and Neck with Perfusion 05/23/2024 12:23:13 PM TECHNIQUE: CTA of the head and neck was performed without and with the administration of 100 mL of iohexol  (OMNIPAQUE ) 350 MG/ML injection. 3D postprocessing with multiplanar reconstructions and MIPs was performed to evaluate the vascular anatomy. Cerebral perfusion analysis using computed tomography with contrast administration, including post-processing of parametric maps with determination of cerebral blood flow, cerebral blood volume, mean transit time and time-to-maximum. Automated exposure control, iterative reconstruction, and/or weight based adjustment of the mA/kV was utilized to reduce the radiation dose to as low as reasonably achievable. COMPARISON: CT of the head dated 05/23/2024 CLINICAL HISTORY: Neuro deficit, acute, stroke suspected. FINDINGS: CTA NECK: AORTIC ARCH AND ARCH VESSELS: No dissection or arterial injury. No significant stenosis of the brachiocephalic or subclavian arteries. CERVICAL CAROTID ARTERIES: No dissection, arterial injury, or hemodynamically significant stenosis by NASCET criteria. CERVICAL VERTEBRAL ARTERIES: No dissection, arterial injury, or significant stenosis. LUNGS AND MEDIASTINUM: Unremarkable. SOFT TISSUES: No acute abnormality. BONES: No acute abnormality. CTA HEAD: ANTERIOR CIRCULATION: No significant stenosis of the internal carotid arteries. No significant stenosis of the anterior cerebral arteries. No significant stenosis of  the middle cerebral arteries. No aneurysm. POSTERIOR CIRCULATION: No significant stenosis of the posterior cerebral arteries. No significant stenosis of the basilar artery. No  significant stenosis of the vertebral arteries. No aneurysm. OTHER: No dural venous sinus thrombosis on this non-dedicated study. CT PERFUSION: EXAM QUALITY: Exam quality is adequate with diagnostic perfusion maps. No significant motion artifact. Appropriate arterial inflow and venous outflow curves. CORE INFARCT (CBF<30% volume): 0 mL TOTAL HYPOPERFUSION (Tmax>6s volume): 0 mL PENUMBRA: Mismatch volume: 0 mL Mismatch ratio: not applicable Location: not applicable The above findings were communicated to Dr. Jerri at 12:29 pm 05/23/2024. IMPRESSION: 1. No acute large vessel occlusion. 2. No hemodynamically significant stenosis or aneurysm in the head or neck vessels. 3. No evidence of ischemia by CT brain perfusion. 4. Above findings were communicated to Dr. Jerri at 12:29 PM on 05/23/2024. Electronically signed by: Evalene Coho MD 05/23/2024 12:30 PM EST RP Workstation: [id number]   CT HEAD CODE STROKE WO CONTRAST Result Date: 05/23/2024 EXAM: CT HEAD WITHOUT CONTRAST 05/23/2024 12:08:43 PM TECHNIQUE: CT of the head was performed without the administration of intravenous contrast. Automated exposure control, iterative reconstruction, and/or weight based adjustment of the mA/kV was utilized to reduce the radiation dose to as low as reasonably achievable. COMPARISON: None available. CLINICAL HISTORY: Neuro deficit, acute, stroke suspected Neuro deficit, acute, stroke suspected Neuro deficit, acute, stroke suspected Neuro deficit, acute, stroke suspected Neuro deficit, acute, stroke suspected FINDINGS: BRAIN AND VENTRICLES: No acute hemorrhage. No evidence of acute infarct. No hydrocephalus. No extra-axial collection. No mass effect or midline shift. ORBITS: No acute abnormality. SINUSES: There is mucosal disease within the frontal and left ethmoid sinuses. There is also minimal mucosal disease within the left sphenoid sinus. SOFT TISSUES AND SKULL: No acute soft tissue abnormality. No skull fracture. [place] Stroke  Program Early CT Score (ASPECTS): Ganglionic (caudate, IC, lentiform nucleus, insula, M1-M3): 7 Supraganglionic (M4-M6): 3 Total: 10 IMPRESSION: 1. No acute intracranial abnormality in the setting of suspected acute stroke. ASPECTS: 10. Findings communicated to Dr. Jerri at 12:15 PM 05/23/2024. 2. Mucosal disease within the frontal and left ethmoid sinuses, with minimal mucosal disease in the left sphenoid sinus. Electronically signed by: Evalene Coho MD 05/23/2024 12:17 PM EST RP Workstation: [id number]    Assessment: 39 y.o. 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 Her limping also getting worse. LSW 1030pm last night. NIHSS = 1. CT and CTA and CTP negative. Pt symptoms not typical for stroke, other etiology exploration need to be done, such as MD, myelitis, spinal cord compression. Pt works as actor. Denies any heavy lifting. Will check MRI brain and C-spine with and without contrast.   Plan: - MRI brain and C-spine w/wo - pain management per EDP - nausea management per EDP - case discussed with EDP Dr. Darra  Thank you for this consultation and allowing us  to participate in the care of this patient.  Ary Jerri, MD PhD Stroke Neurology 05/23/2024 1:06 PM     [1]  Allergies Allergen Reactions   Monoferric  [Ferric Derisomaltose ] Hives and Itching  [2]  No current facility-administered medications on file prior to encounter.   Current Outpatient Medications on File Prior to Encounter  Medication Sig Dispense Refill   cyanocobalamin  (VITAMIN B12) 1000 MCG tablet Take 1 tablet (1,000 mcg  total) by mouth daily. 90 tablet 2   ferrous sulfate  325 (65 FE) MG tablet Take 1 tablet (325 mg total) by mouth daily with breakfast. 90 tablet 0   hydrocortisone  (ANUSOL -HC) 25 MG suppository  Place 1 suppository (25 mg total) rectally at bedtime. (Patient not taking: Reported on 04/19/2024) 12 suppository 0   polyethylene glycol powder (GLYCOLAX /MIRALAX ) 17 GM/SCOOP powder Take 17 g by mouth daily as needed for moderate constipation. Dissolve 1 capful (17g) in 4-8 ounces of liquid and take by mouth daily. 238 g 0

## 2024-05-23 NOTE — ED Provider Notes (Signed)
 "  Emergency Department Provider Note   I have reviewed the triage vital signs and the nursing notes.   HISTORY  Chief Complaint Code Stroke   HPI Maureen Hensley is a 39 y.o. female presents to the emergency department from work as a code stroke.  Last seen normal yesterday evening.  She woke up going to work with a limp and around 10:30 AM developed left arm and leg weakness with some slurred speech and left eye blurry vision.  EMS arrived on scene to activate a code stroke. Patient developed some nausea/vomiting.   Past Medical History:  Diagnosis Date   Anemia     Review of Systems  Constitutional: No fever/chills Cardiovascular: Denies chest pain. Respiratory: Denies shortness of breath. Gastrointestinal: No abdominal pain.  No nausea, no vomiting.  Musculoskeletal: Positive for back pain. Neurological: Negative for headaches. Positive left arm/leg weakness.   ____________________________________________   PHYSICAL EXAM:  VITAL SIGNS: ED Triage Vitals [05/23/24 1230]  Encounter Vitals Group     BP 122/82     Pulse Rate 92     Resp 13     Temp 99.1 F (37.3 C)     Temp Source Oral     SpO2 100 %   Constitutional: Alert and oriented. Well appearing and in no acute distress. Eyes: Conjunctivae are normal.  Head: Atraumatic. Nose: No congestion/rhinnorhea. Mouth/Throat: Mucous membranes are moist.  Neck: No stridor.   Cardiovascular: Normal rate, regular rhythm. Good peripheral circulation. Grossly normal heart sounds.   Respiratory: Normal respiratory effort.  No retractions. Lungs CTAB. Gastrointestinal: Soft and nontender. No distention.  Musculoskeletal: No lower extremity tenderness nor edema. No gross deformities of extremities. Neurologic:  Normal speech and language. 4+/5 strength in the LUE and LLE.  Skin:  Skin is warm, dry and intact. No rash noted.   ____________________________________________   LABS (all labs ordered are listed, but only  abnormal results are displayed)  Labs Reviewed  CBC - Abnormal; Notable for the following components:      Result Value   RBC 3.55 (*)    Hemoglobin 7.0 (*)    HCT 24.5 (*)    MCV 69.0 (*)    MCH 19.7 (*)    MCHC 28.6 (*)    RDW 26.7 (*)    Platelets 140 (*)    nRBC 0.5 (*)    All other components within normal limits  COMPREHENSIVE METABOLIC PANEL WITH GFR - Abnormal; Notable for the following components:   Potassium 3.4 (*)    CO2 20 (*)    Calcium  8.7 (*)    All other components within normal limits  I-STAT CHEM 8, ED - Abnormal; Notable for the following components:   Potassium 3.3 (*)    Calcium , Ion 1.14 (*)    TCO2 20 (*)    Hemoglobin 8.5 (*)    HCT 25.0 (*)    All other components within normal limits  PROTIME-INR  APTT  DIFFERENTIAL  ETHANOL  HCG, SERUM, QUALITATIVE  CBG MONITORING, ED   ____________________________________________  RADIOLOGY  CT ANGIO HEAD NECK W WO CM W PERF (CODE STROKE) Result Date: 05/23/2024 EXAM: CTA Head and Neck with Perfusion 05/23/2024 12:23:13 PM TECHNIQUE: CTA of the head and neck was performed without and with the administration of 100 mL of iohexol  (OMNIPAQUE ) 350 MG/ML injection. 3D postprocessing with multiplanar reconstructions and MIPs was performed to evaluate the vascular anatomy. Cerebral perfusion analysis using computed tomography with contrast administration, including post-processing of parametric maps  with determination of cerebral blood flow, cerebral blood volume, mean transit time and time-to-maximum. Automated exposure control, iterative reconstruction, and/or weight based adjustment of the mA/kV was utilized to reduce the radiation dose to as low as reasonably achievable. COMPARISON: CT of the head dated 05/23/2024 CLINICAL HISTORY: Neuro deficit, acute, stroke suspected. FINDINGS: CTA NECK: AORTIC ARCH AND ARCH VESSELS: No dissection or arterial injury. No significant stenosis of the brachiocephalic or subclavian  arteries. CERVICAL CAROTID ARTERIES: No dissection, arterial injury, or hemodynamically significant stenosis by NASCET criteria. CERVICAL VERTEBRAL ARTERIES: No dissection, arterial injury, or significant stenosis. LUNGS AND MEDIASTINUM: Unremarkable. SOFT TISSUES: No acute abnormality. BONES: No acute abnormality. CTA HEAD: ANTERIOR CIRCULATION: No significant stenosis of the internal carotid arteries. No significant stenosis of the anterior cerebral arteries. No significant stenosis of the middle cerebral arteries. No aneurysm. POSTERIOR CIRCULATION: No significant stenosis of the posterior cerebral arteries. No significant stenosis of the basilar artery. No significant stenosis of the vertebral arteries. No aneurysm. OTHER: No dural venous sinus thrombosis on this non-dedicated study. CT PERFUSION: EXAM QUALITY: Exam quality is adequate with diagnostic perfusion maps. No significant motion artifact. Appropriate arterial inflow and venous outflow curves. CORE INFARCT (CBF<30% volume): 0 mL TOTAL HYPOPERFUSION (Tmax>6s volume): 0 mL PENUMBRA: Mismatch volume: 0 mL Mismatch ratio: not applicable Location: not applicable The above findings were communicated to Dr. Jerri at 12:29 pm 05/23/2024. IMPRESSION: 1. No acute large vessel occlusion. 2. No hemodynamically significant stenosis or aneurysm in the head or neck vessels. 3. No evidence of ischemia by CT brain perfusion. 4. Above findings were communicated to Dr. Jerri at 12:29 PM on 05/23/2024. Electronically signed by: Evalene Coho MD 05/23/2024 12:30 PM EST RP Workstation: [id number]   CT HEAD CODE STROKE WO CONTRAST Result Date: 05/23/2024 EXAM: CT HEAD WITHOUT CONTRAST 05/23/2024 12:08:43 PM TECHNIQUE: CT of the head was performed without the administration of intravenous contrast. Automated exposure control, iterative reconstruction, and/or weight based adjustment of the mA/kV was utilized to reduce the radiation dose to as low as reasonably achievable.  COMPARISON: None available. CLINICAL HISTORY: Neuro deficit, acute, stroke suspected Neuro deficit, acute, stroke suspected Neuro deficit, acute, stroke suspected Neuro deficit, acute, stroke suspected Neuro deficit, acute, stroke suspected FINDINGS: BRAIN AND VENTRICLES: No acute hemorrhage. No evidence of acute infarct. No hydrocephalus. No extra-axial collection. No mass effect or midline shift. ORBITS: No acute abnormality. SINUSES: There is mucosal disease within the frontal and left ethmoid sinuses. There is also minimal mucosal disease within the left sphenoid sinus. SOFT TISSUES AND SKULL: No acute soft tissue abnormality. No skull fracture. [place] Stroke Program Early CT Score (ASPECTS): Ganglionic (caudate, IC, lentiform nucleus, insula, M1-M3): 7 Supraganglionic (M4-M6): 3 Total: 10 IMPRESSION: 1. No acute intracranial abnormality in the setting of suspected acute stroke. ASPECTS: 10. Findings communicated to Dr. Jerri at 12:15 PM 05/23/2024. 2. Mucosal disease within the frontal and left ethmoid sinuses, with minimal mucosal disease in the left sphenoid sinus. Electronically signed by: Evalene Coho MD 05/23/2024 12:17 PM EST RP Workstation: [id number]    ____________________________________________   PROCEDURES  Procedure(s) performed:   Procedures  CRITICAL CARE Performed by: Fonda KANDICE Law Total critical care time: 35 minutes Critical care time was exclusive of separately billable procedures and treating other patients. Critical care was necessary to treat or prevent imminent or life-threatening deterioration. Critical care was time spent personally by me on the following activities: development of treatment plan with patient and/or surrogate as well as nursing, discussions with consultants, evaluation of  patient's response to treatment, examination of patient, obtaining history from patient or surrogate, ordering and performing treatments and interventions, ordering and review of  laboratory studies, ordering and review of radiographic studies, pulse oximetry and re-evaluation of patient's condition.  Fonda Law, MD Emergency Medicine  ____________________________________________   INITIAL IMPRESSION / ASSESSMENT AND PLAN / ED COURSE  Pertinent labs & imaging results that were available during my care of the patient were reviewed by me and considered in my medical decision making (see chart for details).   This patient is Presenting for Evaluation of AMS/CVA symptoms, which does require a range of treatment options, and is a complaint that involves a high risk of morbidity and mortality.  The Differential Diagnoses includes but is not exclusive to alcohol, illicit or prescription medications, intracranial pathology such as stroke, intracerebral hemorrhage, fever or infectious causes including sepsis, hypoxemia, uremia, trauma, endocrine related disorders such as diabetes, hypoglycemia, thyroid -related diseases, etc.   Critical Interventions-    Medications  sodium chloride  flush (NS) 0.9 % injection 3 mL (has no administration in time range)  iohexol  (OMNIPAQUE ) 350 MG/ML injection 100 mL ( Intravenous Canceled Entry 05/23/24 1223)  iohexol  (OMNIPAQUE ) 350 MG/ML injection 100 mL (100 mLs Intravenous Contrast Given 05/23/24 1218)    Reassessment after intervention: symptoms unchanged.    I did obtain Additional Historical Information from EMS, as the patient is having CVA symptoms.  Reviewed past records and in December 2025 patient had an admit for acute lower GI bleeding requiring blood transfusion.  Found to have very low ferritin levels and discharged home with GI follow-up and outpatient iron  infusions.   Clinical Laboratory Tests Ordered, included CBC without leukocytosis. Hb of 7.0. CMP without AKI.   Radiologic Tests Ordered, included CT head/angio neck. I independently interpreted the images and agree with radiology interpretation.   Cardiac Monitor  Tracing which shows NSR.    Social Determinants of Health Risk patient is not an active smoker.   Consult complete with Neurology, Dr. Jerri. Plan for MRI head/c spine and TRH admit.   Medical Decision Making: Summary:  The patient presents to the emergency department with left-sided neurosymptoms activated as a code stroke in the field by EMS.  Ultimately determined last normal was yesterday evening prior to going to bed.  Arrived to work with a slight limp thought to be due to pain.  CT head and angio head/neck unremarkable.  Reevaluation with update and discussion with   ***Considered admission***  Patient's presentation is most consistent with {EM COPA:27473}   Disposition:   ____________________________________________  FINAL CLINICAL IMPRESSION(S) / ED DIAGNOSES  Final diagnoses:  None     NEW OUTPATIENT MEDICATIONS STARTED DURING THIS VISIT:  New Prescriptions   No medications on file    Note:  This document was prepared using Dragon voice recognition software and may include unintentional dictation errors.  Fonda Law, MD, [facility] Emergency Medicine  "

## 2024-05-24 ENCOUNTER — Inpatient Hospital Stay (HOSPITAL_COMMUNITY)

## 2024-05-24 DIAGNOSIS — R29898 Other symptoms and signs involving the musculoskeletal system: Secondary | ICD-10-CM | POA: Diagnosis not present

## 2024-05-24 DIAGNOSIS — D62 Acute posthemorrhagic anemia: Secondary | ICD-10-CM | POA: Diagnosis not present

## 2024-05-24 DIAGNOSIS — N921 Excessive and frequent menstruation with irregular cycle: Secondary | ICD-10-CM

## 2024-05-24 LAB — LIPID PANEL
Cholesterol: 93 mg/dL (ref 0–200)
HDL: 41 mg/dL
LDL Cholesterol: 39 mg/dL (ref 0–99)
Total CHOL/HDL Ratio: 2.3 ratio
Triglycerides: 66 mg/dL
VLDL: 13 mg/dL (ref 0–40)

## 2024-05-24 LAB — PHOSPHORUS: Phosphorus: 4.4 mg/dL (ref 2.5–4.6)

## 2024-05-24 LAB — TSH: TSH: 0.278 u[IU]/mL — ABNORMAL LOW (ref 0.350–4.500)

## 2024-05-24 LAB — CBC
HCT: 22.9 % — ABNORMAL LOW (ref 36.0–46.0)
Hemoglobin: 6.6 g/dL — CL (ref 12.0–15.0)
MCH: 19.9 pg — ABNORMAL LOW (ref 26.0–34.0)
MCHC: 28.8 g/dL — ABNORMAL LOW (ref 30.0–36.0)
MCV: 69.2 fL — ABNORMAL LOW (ref 80.0–100.0)
Platelets: 137 K/uL — ABNORMAL LOW (ref 150–400)
RBC: 3.31 MIL/uL — ABNORMAL LOW (ref 3.87–5.11)
RDW: 27.6 % — ABNORMAL HIGH (ref 11.5–15.5)
WBC: 4.6 K/uL (ref 4.0–10.5)
nRBC: 0.7 % — ABNORMAL HIGH (ref 0.0–0.2)

## 2024-05-24 LAB — HCG, SERUM, QUALITATIVE: Preg, Serum: NEGATIVE

## 2024-05-24 LAB — BASIC METABOLIC PANEL WITH GFR
Anion gap: 9 (ref 5–15)
BUN: 6 mg/dL (ref 6–20)
CO2: 24 mmol/L (ref 22–32)
Calcium: 8.6 mg/dL — ABNORMAL LOW (ref 8.9–10.3)
Chloride: 103 mmol/L (ref 98–111)
Creatinine, Ser: 0.62 mg/dL (ref 0.44–1.00)
GFR, Estimated: >60 mL/min
Glucose, Bld: 101 mg/dL — ABNORMAL HIGH (ref 70–99)
Potassium: 3.2 mmol/L — ABNORMAL LOW (ref 3.5–5.1)
Sodium: 135 mmol/L (ref 135–145)

## 2024-05-24 LAB — MAGNESIUM: Magnesium: 1.6 mg/dL — ABNORMAL LOW (ref 1.7–2.4)

## 2024-05-24 LAB — HEMOGLOBIN A1C
Hgb A1c MFr Bld: 4.9 % (ref 4.8–5.6)
Mean Plasma Glucose: 93.93 mg/dL

## 2024-05-24 LAB — T4, FREE: Free T4: 1.47 ng/dL (ref 0.80–2.00)

## 2024-05-24 LAB — PREPARE RBC (CROSSMATCH)

## 2024-05-24 LAB — VITAMIN D 25 HYDROXY (VIT D DEFICIENCY, FRACTURES): Vit D, 25-Hydroxy: <6 ng/mL — ABNORMAL LOW (ref 30–100)

## 2024-05-24 MED ORDER — ONDANSETRON HCL 4 MG/2ML IJ SOLN
4.0000 mg | Freq: Four times a day (QID) | INTRAMUSCULAR | Status: DC | PRN
  Administered 2024-05-24: 4 mg via INTRAVENOUS
  Filled 2024-05-24: qty 2

## 2024-05-24 MED ORDER — VITAMIN D (ERGOCALCIFEROL) 1.25 MG (50000 UNIT) PO CAPS
50000.0000 [IU] | ORAL_CAPSULE | ORAL | Status: DC
  Administered 2024-05-24: 50000 [IU] via ORAL
  Filled 2024-05-24: qty 1

## 2024-05-24 MED ORDER — POTASSIUM CHLORIDE 10 MEQ/100ML IV SOLN
10.0000 meq | INTRAVENOUS | Status: AC
  Administered 2024-05-24 (×6): 10 meq via INTRAVENOUS
  Filled 2024-05-24 (×6): qty 100

## 2024-05-24 MED ORDER — MEGESTROL ACETATE 40 MG PO TABS
40.0000 mg | ORAL_TABLET | Freq: Two times a day (BID) | ORAL | Status: DC
  Administered 2024-05-24 – 2024-05-25 (×3): 40 mg via ORAL
  Filled 2024-05-24 (×4): qty 1

## 2024-05-24 MED ORDER — PANTOPRAZOLE SODIUM 40 MG PO TBEC
40.0000 mg | DELAYED_RELEASE_TABLET | Freq: Every day | ORAL | Status: DC
  Administered 2024-05-24 – 2024-05-25 (×2): 40 mg via ORAL
  Filled 2024-05-24 (×2): qty 1

## 2024-05-24 MED ORDER — TRAZODONE HCL 50 MG PO TABS: 50.0000 mg | ORAL_TABLET | Freq: Every evening | ORAL | Status: DC | PRN

## 2024-05-24 MED ORDER — GLUCAGON HCL RDNA (DIAGNOSTIC) 1 MG IJ SOLR: 1.0000 mg | INTRAMUSCULAR | Status: DC | PRN

## 2024-05-24 MED ORDER — SENNOSIDES-DOCUSATE SODIUM 8.6-50 MG PO TABS: 1.0000 | ORAL_TABLET | Freq: Every evening | ORAL | Status: DC | PRN

## 2024-05-24 MED ORDER — SODIUM CHLORIDE 0.9% IV SOLUTION: Freq: Once | INTRAVENOUS | Status: AC

## 2024-05-24 MED ORDER — METOPROLOL TARTRATE 5 MG/5ML IV SOLN: 5.0000 mg | INTRAVENOUS | Status: DC | PRN

## 2024-05-24 MED ORDER — HYDRALAZINE HCL 20 MG/ML IJ SOLN: 10.0000 mg | INTRAMUSCULAR | Status: DC | PRN

## 2024-05-24 MED ORDER — KETOROLAC TROMETHAMINE 30 MG/ML IJ SOLN
30.0000 mg | Freq: Once | INTRAMUSCULAR | Status: AC
  Administered 2024-05-24: 30 mg via INTRAVENOUS
  Filled 2024-05-24: qty 1

## 2024-05-24 MED ORDER — GUAIFENESIN 100 MG/5ML PO LIQD: 5.0000 mL | ORAL | Status: DC | PRN

## 2024-05-24 MED ORDER — ENOXAPARIN SODIUM 40 MG/0.4ML IJ SOSY: 40.0000 mg | PREFILLED_SYRINGE | INTRAMUSCULAR | Status: DC

## 2024-05-24 MED ORDER — MAGNESIUM SULFATE 4 GM/100ML IV SOLN
4.0000 g | Freq: Once | INTRAVENOUS | Status: AC
  Administered 2024-05-24: 4 g via INTRAVENOUS
  Filled 2024-05-24: qty 100

## 2024-05-24 MED ORDER — IPRATROPIUM-ALBUTEROL 0.5-2.5 (3) MG/3ML IN SOLN: 3.0000 mL | RESPIRATORY_TRACT | Status: DC | PRN

## 2024-05-24 NOTE — Evaluation (Signed)
 Physical Therapy Evaluation Patient Details Name: Maureen Hensley MRN: [id number] DOB: [birth year] Today's Date: 05/24/2024  History of Present Illness  39 y.o. female presents to [facility] hospital on 05/23/2024 with LLE weakness along with sharp back and neck pain. MRI negative for acute findings. PMH includes anemia.  Clinical Impression  Pt presents to PT with deficits in activity tolerance, strength, power, gait. Pt is limited by back and neck pain at this time. Pt reports working 2 jobs which involve multiple hours of relatively static standing. PT notes significant tightness in bilateral hamstrings as well as decreased lumbar lordosis. PT provides HEP for attempts at managing low back pain. Pt will benefit from frequent mobilization in an effort to reduce back pain. Pt will also benefit from modifications to her work, including allowing for periods of sitting during her job at [employer] to avoid prolonged periods of static standing. PT recommends outpatient PT follow-up after discharge.        If plan is discharge home, recommend the following: Assistance with cooking/housework;Assist for transportation;Help with stairs or ramp for entrance   Can travel by private vehicle        Equipment Recommendations None recommended by PT  Recommendations for Other Services       Functional Status Assessment Patient has had a recent decline in their functional status and demonstrates the ability to make significant improvements in function in a reasonable and predictable amount of time.     Precautions / Restrictions Precautions Precautions: Fall Recall of Precautions/Restrictions: Intact Restrictions Weight Bearing Restrictions Per Provider Order: No      Mobility  Bed Mobility Overal bed mobility: Modified Independent                  Transfers Overall transfer level: Modified independent Equipment used: None                    Ambulation/Gait Ambulation/Gait assistance:  Modified independent (Device/Increase time) Gait Distance (Feet): 200 Feet Assistive device: None Gait Pattern/deviations: Step-through pattern Gait velocity: reduced Gait velocity interpretation: <1.8 ft/sec, indicate of risk for recurrent falls   General Gait Details: slowed step-through gait with reduced stance time on LLE  Stairs            Wheelchair Mobility     Tilt Bed    Modified Rankin (Stroke Patients Only)       Balance Overall balance assessment: Needs assistance Sitting-balance support: No upper extremity supported, Feet supported Sitting balance-Leahy Scale: Good     Standing balance support: No upper extremity supported, During functional activity Standing balance-Leahy Scale: Good                               Pertinent Vitals/Pain Pain Assessment Pain Assessment: 0-10 Pain Score: 8  Pain Location: back Pain Descriptors / Indicators: Aching Pain Intervention(s): Monitored during session    Home Living Family/patient expects to be discharged to:: Private residence Living Arrangements: Spouse/significant other;Other relatives (significant other and many children) Available Help at Discharge: Family;Available PRN/intermittently Type of Home: House Home Access: Stairs to enter Entrance Stairs-Rails: Can reach both Entrance Stairs-Number of Steps: 5 Alternate Level Stairs-Number of Steps: 17 Home Layout: Two level Home Equipment: None      Prior Function Prior Level of Function : Independent/Modified Independent;Working/employed             Mobility Comments: working 2 jobs involving standing for many consecutive hours  Extremity/Trunk Assessment   Upper Extremity Assessment Upper Extremity Assessment: Overall WFL for tasks assessed    Lower Extremity Assessment Lower Extremity Assessment: LLE deficits/detail LLE Deficits / Details: grossly 4+/5    Cervical / Trunk Assessment Cervical / Trunk Assessment:  Other exceptions Cervical / Trunk Exceptions: pt reports low back pain and neck pain. Pt is unable to identify any alleviating or aggravating factors  Communication   Communication Communication: No apparent difficulties    Cognition Arousal: Alert Behavior During Therapy: WFL for tasks assessed/performed   PT - Cognitive impairments: No apparent impairments                         Following commands: Intact       Cueing Cueing Techniques: Verbal cues     General Comments General comments (skin integrity, edema, etc.): VSS on RA    Exercises Other Exercises Other Exercises: PT provides education on HEP for maangement of low back pain, including bilateral hamstring stretch from a seated position and anterior pelvic tilt exercise. Pt also provides education on use of a towel roll for passive positioning of lumbar spine   Assessment/Plan    PT Assessment Patient needs continued PT services  PT Problem List Decreased activity tolerance;Decreased balance;Decreased mobility;Pain       PT Treatment Interventions Gait training;Stair training;Functional mobility training;Therapeutic activities;Therapeutic exercise;Neuromuscular re-education;Balance training;Patient/family education    PT Goals (Current goals can be found in the Care Plan section)  Acute Rehab PT Goals Patient Stated Goal: to reduce back pain PT Goal Formulation: With patient Time For Goal Achievement: 06/07/24 Potential to Achieve Goals: Good Additional Goals Additional Goal #1: Pt will score >19/24 on the DGI to indicate a reduced risk for falls    Frequency Min 1X/week     Co-evaluation               AM-PAC PT 6 Clicks Mobility  Outcome Measure Help needed turning from your back to your side while in a flat bed without using bedrails?: None Help needed moving from lying on your back to sitting on the side of a flat bed without using bedrails?: None Help needed moving to and from a bed  to a chair (including a wheelchair)?: None Help needed standing up from a chair using your arms (e.g., wheelchair or bedside chair)?: None Help needed to walk in hospital room?: None Help needed climbing 3-5 steps with a railing? : None 6 Click Score: 24    End of Session   Activity Tolerance: Patient tolerated treatment well Patient left: in bed;with call bell/phone within reach Nurse Communication: Mobility status PT Visit Diagnosis: Other abnormalities of gait and mobility (R26.89);Pain Pain - part of body:  (back/neck)    Time: 8992-8960 PT Time Calculation (min) (ACUTE ONLY): 32 min   Charges:   PT Evaluation $PT Eval Low Complexity: 1 Low   PT General Charges $$ ACUTE PT VISIT: 1 Visit         Bernardino JINNY Ruth, PT, DPT Acute Rehabilitation Office [phone]   Bernardino JINNY Ruth 05/24/2024, 1:26 PM

## 2024-05-24 NOTE — Progress Notes (Signed)
" ° °  Brief Progress Note   _____________________________________________________________________________________________________________  Patient Name: Maureen Hensley Patient DOB: 03/03/1986 Date: @TODAY @      Data: Reviewed labs, VS, notes.      Action: No action needed at this time.      Response:    _____________________________________________________________________________________________________________  The [facility] RN Expeditor Sharolyn JONETTA Batman Please contact us  directly via secure chat (search for [facility]) or by calling us  at [phone] [facility]).  "

## 2024-05-24 NOTE — ED Notes (Signed)
 Talked with MD on the phone. Blood transfusion ordered. Consent is signed.

## 2024-05-24 NOTE — ED Notes (Signed)
 Patient transported to Ultrasound

## 2024-05-24 NOTE — ED Notes (Signed)
 Pt resting with eyes closed, respirations spontaneous, even, and unlabored.

## 2024-05-24 NOTE — Consult Note (Signed)
" ° °  OB/GYN Telephone Consult  05/24/2024   MARION SEESE is a 39 y.o. No obstetric history on file. who is currently not pregnant presenting to [facility].   I was called for a consult regarding the care of this patient by the Physician (MD/DO) caring for the patient.   The provider had the following clinical question: Management of chronic menstrual blood loss  The provider presented the following relevant clinical information: History, labs and physical exam  I performed a chart review on the patient and reviewed available documentation.  BP 120/81   Pulse 81   Temp 98 F (36.7 C) (Oral)   Resp 20   Ht 5' 2 (1.575 m)   Wt 61.7 kg   SpO2 100%   BMI 24.88 kg/m   Exam- performed by consulting provider   Recommendations:  -recommend TVUS if possible before discharge -no evidence or history of DVT/PE, can start megace  40 mg  -pt will need endometrial biopsy or possibly hysteroscopy D and C for further eval -Recommended MD/APP provide the patient with a referral to the [facility] for [facility] (any office) for follow up in  3 weeks.   Thank you for this consult and if additional recommendations are needed please call [phone] for the OB/GYN attending on service at [facility].   I spent approximately 10 minutes directly consulting with the provider and verbally discussing this case. Additionally 15 minutes minutes was spent performing chart review and documentation.    Jerilynn DELENA Buddle, MD   Criteria for phone consult billing? (If answer to any of these are yes then you cannot bill this telephone consult) Will the patient be seen urgently (within 24hrs) at a [facility] practice? No Is this a patient on which I performed surgery within the last 7d? No Have you billed a telephone consult on this patient in the last 7d? No    CPT Code (based on total time, direct and indirect) Interprofessional Telephone/ Internet/ Electronic Health Record consultations:    99446 (5-28minutes) 99447 (11-12 minutes) 99448 (21-30 minutes), 99449 (31 minutes or more)    "

## 2024-05-24 NOTE — Plan of Care (Signed)
   Problem: Education: Goal: Knowledge of disease or condition will improve Outcome: Progressing Goal: Knowledge of secondary prevention will improve (MUST DOCUMENT ALL) Outcome: Progressing Goal: Knowledge of patient specific risk factors will improve (DELETE if not current risk factor) Outcome: Progressing

## 2024-05-24 NOTE — Plan of Care (Signed)
 Signed off patient care by Dr. Jerri.  She was seen as a code stroke.  Brain and C-spine imaging was recommended.  No evidence of acute stroke on brain imaging.  No evidence of demyelination or any other explanation for left leg weakness.  C-spine MRI showed edema and enhancement at the C6 inferior endplate and subtle enhancement of the C7 superior endplate-favored degenerative in etiology and infection considered less likely.  Mild DJD elsewhere as well. No further inpatient neurological workup Medical management per primary team as you are Plan relayed to Dr. Caleen Please call inpatient neurology with questions as needed

## 2024-05-24 NOTE — Hospital Course (Addendum)
 Brief Narrative:  39 year old female with history of anemia secondary to endometriosis/menorrhagia admitted for left lower extremity weakness.  Upon admission CTA head and neck did not show any LVO occlusion or acute abnormality.  Neurology was consulted,  CT head, CTA head and neck are unremarkable.  MRI brain is negative.  MRI cervical spine showing some edema around C6-C7 which is likely chronic changes.  LDL 39, A1c 4.9. Baseline hemoglobin around 7.5, hb 6.6 therefore 1 unit PRBC ordered.  Repeat hemoglobin stable.  Getting outpatient IV iron .  Also follows with GI and hematology.  Discussed with GYN, Dr. Zina.  Started on Megace  twice daily.  Transabdominal ultrasound overall unremarkable but patient declined transvaginal ultrasound.  She states she will follow-up outpatient. During hospitalization also required electrolyte repletion. Medically stable for discharge   Assessment & Plan:  Left lower extremity weakness - CT head, CTA head and neck are unremarkable.  MRI brain is negative.  MRI cervical spine showing some edema around C6-C7 which is likely chronic changes.  LDL 39, A1c 4.9.  Will await neurology recommendations  Hypokalemia/hypocalcemia Secondary to generalized weakness - Repletion.  PT/OT recommended outpatient follow-up.  Had a low TSH but normal free T4  Severe vitamin D  deficiency - Supplements  Endometriosis/menorrhagia Acute blood loss anemia Severe iron  deficiency - Baseline hemoglobin around 7.5, hb 6.6 therefore 1 unit PRBC ordered.  Repeat hemoglobin stable.  Getting outpatient IV iron .  Also follows with GI and hematology.  Discussed with GYN, Dr. Zina.  Started on Megace  twice daily.  Transabdominal ultrasound overall unremarkable but patient declined transvaginal ultrasound.  She states she will follow-up outpatient.  December 2025 Colonoscopy-internal hemorrhoids Endoscopy-gastritis   DVT prophylaxis: enoxaparin  (LOVENOX ) injection 40 mg Start: 05/25/24  2200      Code Status: Full Code Family Communication:   Status is: Inpatient Remains inpatient appropriate because: disCharge today  PT Follow up Recs: Outpatient Pt1/20/2026 1316  Subjective: Feeling well no complaints Declines inpatient transvaginal ultrasound.  States that she will follow-up outpatient   Examination:  General exam: Appears calm and comfortable  Respiratory system: Clear to auscultation. Respiratory effort normal. Cardiovascular system: S1 & S2 heard, RRR. No JVD, murmurs, rubs, gallops or clicks. No pedal edema. Gastrointestinal system: Abdomen is nondistended, soft and nontender. No organomegaly or masses felt. Normal bowel sounds heard. Central nervous system: Alert and oriented. No focal neurological deficits. Extremities: Symmetric 5 x 5 power. Skin: No rashes, lesions or ulcers Psychiatry: Judgement and insight appear normal. Mood & affect appropriate.

## 2024-05-24 NOTE — TOC Initial Note (Signed)
 Transition of Care [facility]) - Initial/Assessment Note    Patient Details  Name: Maureen Hensley MRN: [id number] Date of Birth: [birth year]  [facility] CM/SW Contact:    Andrez JULIANNA George, RN Phone Number: 05/24/2024, 3:55 PM  Clinical Narrative:                 Maureen Hensley is a 39 y.o. female with medical history significant of anemia 2/2 endometriosis/menorrhagia who p/w LLE weakness c/f acute stroke.   Pt is from home with friends. She says someone is usually at the home with her.  She doesn't drive but states her friends can provide transportation.  She manages her iron  without issues at home.  Outpatient PT referral sent to [facility]. Information on the AVS.  ICM following for further dc needs.   Expected Discharge Plan: OP Rehab Barriers to Discharge: Continued Medical Work up   Patient Goals and CMS Choice     Choice offered to / list presented to : Patient      Expected Discharge Plan and Services   Discharge Planning Services: CM Consult   Living arrangements for the past 2 months: Single Family Home                                      Prior Living Arrangements/Services Living arrangements for the past 2 months: Single Family Home Lives with:: Friends Patient language and need for interpreter reviewed:: Yes Do you feel safe going back to the place where you live?: Yes        Care giver support system in place?: Yes (comment)   Criminal Activity/Legal Involvement Pertinent to Current Situation/Hospitalization: No - Comment as needed  Activities of Daily Living   ADL Screening (condition at time of admission) Independently performs ADLs?: Yes (appropriate for developmental age) Is the patient deaf or have difficulty hearing?: No Does the patient have difficulty seeing, even when wearing glasses/contacts?: No Does the patient have difficulty concentrating, remembering, or making decisions?: No  Permission Sought/Granted                   Emotional Assessment Appearance:: Appears stated age Attitude/Demeanor/Rapport: Engaged Affect (typically observed): Accepting Orientation: : Oriented to Self, Oriented to Place, Oriented to  Time, Oriented to Situation   Psych Involvement: No (comment)  Admission diagnosis:  Left-sided weakness [R53.1] Transient left leg weakness [R29.898] Patient Active Problem List   Diagnosis Date Noted   Transient left leg weakness 05/23/2024   Vitamin B12 deficiency 04/25/2024   Iron  deficiency anemia, unspecified 04/11/2024   Rectal bleeding 04/07/2024   Gastritis and gastroduodenitis 04/07/2024   Lower GI bleeding 04/06/2024   Symptomatic anemia 04/06/2024   Pain of upper abdomen 04/06/2024   Chest pain 07/04/2023   Thrombocytosis 07/04/2023   Acute on chronic blood loss anemia 06/30/2018   Iron  deficiency anemia 06/30/2018   Menorrhagia 06/30/2018   Viral gastroenteritis 06/30/2018   HA (headache) 06/30/2018   PCP:  Gari Lauraine BRAVO, FNP Pharmacy:   [facility] [id number] [place], [place] - [address] AT [facility] [address] [place] [address] Phone: [phone] Fax: [phone]  [id number] - [place], [place] - [address] AT [facility] [address] [facility] [address] [place] [address] Phone: [phone] Fax: [phone]  [facility] [address] [place] [address] Phone: [phone] Fax: [phone]     Social Drivers of Health (SDOH) Social History: SDOH Screenings   Food Insecurity: No Food Insecurity (05/24/2024)  Recent Concern: Food Insecurity - Food Insecurity Present (04/06/2024)  Housing: Low Risk (05/24/2024)  Transportation Needs: No Transportation Needs (05/24/2024)  Utilities: Not At Risk (05/24/2024)  Alcohol Screen: Low Risk (04/19/2024)  Depression (PHQ2-9): Low Risk (04/25/2024)  Financial Resource Strain: Low Risk (04/19/2024)  Physical  Activity: Inactive (04/19/2024)  Social Connections: Moderately Isolated (07/05/2023)  Stress: Stress Concern Present (04/19/2024)  Tobacco Use: Medium Risk (05/23/2024)   SDOH Interventions:     Readmission Risk Interventions    04/07/2024    2:15 PM 04/07/2024    2:08 PM  Readmission Risk Prevention Plan  Post Dischage Appt Complete   Medication Screening Complete Complete  Transportation Screening Complete Complete

## 2024-05-24 NOTE — Progress Notes (Signed)
 Reviewed patient on unit. Patient ambulated from ED bed to restroom with standby assist. Once in bed alarm turned on., bed locked in the lowest position and patient educated to call if needing to ambulate. Stated understanding

## 2024-05-24 NOTE — Progress Notes (Signed)
 " PROGRESS NOTE    COURTNI BALASH  FMW:[id number] DOB: 04-24-1986 DOA: 05/23/2024 PCP: Gari Lauraine BRAVO, FNP    Brief Narrative:  39 year old female with history of anemia secondary to endometriosis/menorrhagia admitted for left lower extremity weakness.  Upon admission CTA head and neck did not show any LVO occlusion or acute abnormality.  Neurology was consulted   Assessment & Plan:  Left lower extremity weakness - CT head, CTA head and neck are unremarkable.  MRI brain is negative.  MRI cervical spine showing some edema around C6-C7 which is likely chronic changes.  LDL 39, A1c 4.9.  Will await neurology recommendations  Hypokalemia/hypocalcemia Secondary to generalized weakness - Repletion.  Will check magnesium , phosphorus - PT/OT -Low TSH, check free T4  Severe vitamin D  deficiency - Supplements  Endometriosis/menorrhagia Acute blood loss anemia Severe iron  deficiency - Baseline hemoglobin around 7.5, this morning 6.6 therefore 1 unit PRBC ordered.  Getting outpatient IV iron .  Also follows with GI and hematology.  Poor follow-up with GYN. I spoke with Dr. Jerilynn Buddle who will help arrange for outpatient follow-up but in the meantime advised we can start Megace  twice daily and obtain transvaginal ultrasound.  Appreciate his input.  December 2025 Colonoscopy-internal hemorrhoids Endoscopy-gastritis   DVT prophylaxis: enoxaparin  (LOVENOX ) injection 40 mg Start: 05/23/24 2200      Code Status: Full Code Family Communication:   Status is: Inpatient Remains inpatient appropriate because: Ongoing management for anemia and severe electrolyte abnormalities   PT Follow up Recs:   Subjective: Seen at bedside feels quite weak.  States she has not really followed up with anyone outpatient.  Currently in her menstrual cycle   Examination:  General exam: Appears calm and comfortable  Respiratory system: Clear to auscultation. Respiratory effort normal. Cardiovascular  system: S1 & S2 heard, RRR. No JVD, murmurs, rubs, gallops or clicks. No pedal edema. Gastrointestinal system: Abdomen is nondistended, soft and nontender. No organomegaly or masses felt. Normal bowel sounds heard. Central nervous system: Alert and oriented. No focal neurological deficits. Extremities: Symmetric 5 x 5 power. Skin: No rashes, lesions or ulcers Psychiatry: Judgement and insight appear normal. Mood & affect appropriate.                Diet Orders (From admission, onward)     Start     Ordered   05/24/24 0618  Diet regular Room service appropriate? Yes; Fluid consistency: Thin  Diet effective now       Question Answer Comment  Room service appropriate? Yes   Fluid consistency: Thin      05/24/24 0618            Objective: Vitals:   05/24/24 0700 05/24/24 0805 05/24/24 0900 05/24/24 1012  BP: 116/64 112/84 120/81   Pulse: 78 (!) 108 81   Resp: (!) 24 16 20    Temp:    98 F (36.7 C)  TempSrc:    Oral  SpO2: 100% 100% 100%   Weight:      Height:        Intake/Output Summary (Last 24 hours) at 05/24/2024 1051 Last data filed at 05/24/2024 0626 Gross per 24 hour  Intake 543.33 ml  Output --  Net 543.33 ml   Filed Weights   05/23/24 1700  Weight: 61.7 kg    Scheduled Meds:  acetaminophen   1,000 mg Oral TID   [START ON 05/25/2024] enoxaparin  (LOVENOX ) injection  40 mg Subcutaneous Q24H   gabapentin   300 mg Oral TID  megestrol   40 mg Oral BID   methocarbamol   500 mg Oral TID   pantoprazole   40 mg Oral QAC breakfast   Vitamin D  (Ergocalciferol )  50,000 Units Oral Q7 days   Continuous Infusions:  magnesium  sulfate bolus IVPB     potassium chloride  10 mEq (05/24/24 0952)    Nutritional status     Body mass index is 24.88 kg/m.  Data Reviewed:   CBC: Recent Labs  Lab 05/23/24 1202 05/23/24 1204 05/24/24 0253  WBC 7.5  --  4.6  NEUTROABS 5.9  --   --   HGB 7.0* 8.5* 6.6*  HCT 24.5* 25.0* 22.9*  MCV 69.0*  --  69.2*  PLT 140*   --  137*   Basic Metabolic Panel: Recent Labs  Lab 05/23/24 1202 05/23/24 1204 05/24/24 0253 05/24/24 0852  NA 137 140 135  --   K 3.4* 3.3* 3.2*  --   CL 105 104 103  --   CO2 20*  --  24  --   GLUCOSE 97 96 101*  --   BUN 8 6 6   --   CREATININE 0.57 0.60 0.62  --   CALCIUM  8.7*  --  8.6*  --   MG  --   --   --  1.6*   GFR: Estimated Creatinine Clearance: 83.1 mL/min (by C-G formula based on SCr of 0.62 mg/dL). Liver Function Tests: Recent Labs  Lab 05/23/24 1202  AST 18  ALT 12  ALKPHOS 48  BILITOT 0.5  PROT 6.5  ALBUMIN 4.1   No results for input(s): LIPASE, AMYLASE in the last 168 hours. No results for input(s): AMMONIA in the last 168 hours. Coagulation Profile: Recent Labs  Lab 05/23/24 1202  INR 1.1   Cardiac Enzymes: No results for input(s): CKTOTAL, CKMB, CKMBINDEX, TROPONINI in the last 168 hours. BNP (last 3 results) No results for input(s): PROBNP in the last 8760 hours. HbA1C: Recent Labs    05/24/24 0253  HGBA1C 4.9   CBG: Recent Labs  Lab 05/23/24 1158  GLUCAP 96   Lipid Profile: Recent Labs    05/24/24 0253  CHOL 93  HDL 41  LDLCALC 39  TRIG 66  CHOLHDL 2.3   Thyroid  Function Tests: Recent Labs    05/24/24 0852  TSH 0.278*   Anemia Panel: No results for input(s): VITAMINB12, FOLATE, FERRITIN, TIBC, IRON , RETICCTPCT in the last 72 hours. Sepsis Labs: No results for input(s): PROCALCITON, LATICACIDVEN in the last 168 hours.  No results found for this or any previous visit (from the past 240 hours).       Radiology Studies: MR BRAIN W WO CONTRAST Result Date: 05/23/2024 EXAM: MRI BRAIN WITH AND WITHOUT CONTRAST 05/23/2024 03:27:17 PM TECHNIQUE: Multiplanar multisequence MRI of the head/brain was performed with and without the administration of intravenous contrast. COMPARISON: Head CT and CTA 05/23/2024. CLINICAL HISTORY: Neuro deficit, acute, stroke suspected. Left leg weakness.  FINDINGS: BRAIN AND VENTRICLES: There is no evidence of an acute infarct, intracranial hemorrhage, mass, midline shift, hydrocephalus, or extra-axial fluid collection. Cerebral volume is borderline low for age. The brain is normal in signal, and no abnormal enhancement is identified. Major intracranial vascular flow voids are preserved. ORBITS: No significant abnormality. SINUSES: Left frontal and left ethmoid sinus secretions. Clear mastoid air cells. BONES AND SOFT TISSUES: Normal bone marrow signal. No soft tissue abnormality. IMPRESSION: 1. No acute intracranial abnormality or focal finding to explain left leg weakness. Electronically signed by: Dasie Hamburg MD 05/23/2024 03:49  PM EST RP Workstation: [id number]   MR CERVICAL SPINE W WO CONTRAST Result Date: 05/23/2024 EXAM: MRI CERVICAL SPINE WITH AND WITHOUT CONTRAST 05/23/2024 03:10:25 PM TECHNIQUE: Multiplanar multisequence MRI of the cervical spine was performed without and with the administration of intravenous contrast. 6 mL (gadobutrol  (GADAVIST ) 1 MMOL/ML injection 6 mL GADOBUTROL  1 MMOL/ML IV SOLN). COMPARISON: Same day CTA head and neck. CLINICAL HISTORY: Neck pain, acute, no red flags. FINDINGS: BONES AND ALIGNMENT: Straightening of the normal cervical lordosis. No evidence of traumatic malalignment. Normal vertebral body heights. Bone marrow signal abnormality and bone marrow edema along the C6 inferior endplate with associated enhancement. Additional subtle endplate enhancement along the C7 superior endplate without associated edema. Findings are favored to be degenerative in etiology. SPINAL CORD: Normal spinal cord size. Normal spinal cord signal. SOFT TISSUES: No paraspinal mass. There is no paraspinal or epidural fluid collection. C2-C3: No significant disc herniation. No spinal canal stenosis or neural foraminal narrowing. C3-C4: No significant disc herniation. No spinal canal stenosis or neural foraminal narrowing. C4-C5: No significant disc  herniation. No spinal canal stenosis or neural foraminal narrowing. C5-C6: There is a small disc bulge at C5-C6 which indents the ventral thecal sac without spinal canal stenosis. Subtle signal abnormality in the posterior aspect of the C5-C6 disc is likely degenerative etiology. C6-C7: Additional mild disc desiccation and small disc bulge at the C6-C7 level which indents the ventral thecal sac without contacting the spinal cord. There is no significant signal abnormality or fluid or enhancement within the C6-C7 disc space. C7-T1: No significant disc herniation. No spinal canal stenosis or neural foraminal narrowing. There is no high grade spinal canal stenosis in the cervical spine. No high grade foraminal stenosis in the cervical spine. IMPRESSION: 1. Edema and enhancement at the C6 inferior endplate and subtle enhancement of the C7 superior endplate, favored degenerative in etiology. Infection is considered less likely. 2. There are mild degenerative changes of the C5-6 and C6-7 discs. 3. No fluid or signal abnormality in the C6-7 disc to suggest discitis. 4. No epidural or paraspinal fluid collection. 5. No high-grade spinal canal or foraminal stenosis. Electronically signed by: Donnice Mania MD 05/23/2024 03:47 PM EST RP Workstation: [id number]   CT ANGIO HEAD NECK W WO CM W PERF (CODE STROKE) Result Date: 05/23/2024 EXAM: CTA Head and Neck with Perfusion 05/23/2024 12:23:13 PM TECHNIQUE: CTA of the head and neck was performed without and with the administration of 100 mL of iohexol  (OMNIPAQUE ) 350 MG/ML injection. 3D postprocessing with multiplanar reconstructions and MIPs was performed to evaluate the vascular anatomy. Cerebral perfusion analysis using computed tomography with contrast administration, including post-processing of parametric maps with determination of cerebral blood flow, cerebral blood volume, mean transit time and time-to-maximum. Automated exposure control, iterative reconstruction,  and/or weight based adjustment of the mA/kV was utilized to reduce the radiation dose to as low as reasonably achievable. COMPARISON: CT of the head dated 05/23/2024 CLINICAL HISTORY: Neuro deficit, acute, stroke suspected. FINDINGS: CTA NECK: AORTIC ARCH AND ARCH VESSELS: No dissection or arterial injury. No significant stenosis of the brachiocephalic or subclavian arteries. CERVICAL CAROTID ARTERIES: No dissection, arterial injury, or hemodynamically significant stenosis by NASCET criteria. CERVICAL VERTEBRAL ARTERIES: No dissection, arterial injury, or significant stenosis. LUNGS AND MEDIASTINUM: Unremarkable. SOFT TISSUES: No acute abnormality. BONES: No acute abnormality. CTA HEAD: ANTERIOR CIRCULATION: No significant stenosis of the internal carotid arteries. No significant stenosis of the anterior cerebral arteries. No significant stenosis of the middle cerebral arteries. No aneurysm. POSTERIOR  CIRCULATION: No significant stenosis of the posterior cerebral arteries. No significant stenosis of the basilar artery. No significant stenosis of the vertebral arteries. No aneurysm. OTHER: No dural venous sinus thrombosis on this non-dedicated study. CT PERFUSION: EXAM QUALITY: Exam quality is adequate with diagnostic perfusion maps. No significant motion artifact. Appropriate arterial inflow and venous outflow curves. CORE INFARCT (CBF<30% volume): 0 mL TOTAL HYPOPERFUSION (Tmax>6s volume): 0 mL PENUMBRA: Mismatch volume: 0 mL Mismatch ratio: not applicable Location: not applicable The above findings were communicated to Dr. Jerri at 12:29 pm 05/23/2024. IMPRESSION: 1. No acute large vessel occlusion. 2. No hemodynamically significant stenosis or aneurysm in the head or neck vessels. 3. No evidence of ischemia by CT brain perfusion. 4. Above findings were communicated to Dr. Jerri at 12:29 PM on 05/23/2024. Electronically signed by: Evalene Coho MD 05/23/2024 12:30 PM EST RP Workstation: [id number]   CT HEAD CODE  STROKE WO CONTRAST Result Date: 05/23/2024 EXAM: CT HEAD WITHOUT CONTRAST 05/23/2024 12:08:43 PM TECHNIQUE: CT of the head was performed without the administration of intravenous contrast. Automated exposure control, iterative reconstruction, and/or weight based adjustment of the mA/kV was utilized to reduce the radiation dose to as low as reasonably achievable. COMPARISON: None available. CLINICAL HISTORY: Neuro deficit, acute, stroke suspected Neuro deficit, acute, stroke suspected Neuro deficit, acute, stroke suspected Neuro deficit, acute, stroke suspected Neuro deficit, acute, stroke suspected FINDINGS: BRAIN AND VENTRICLES: No acute hemorrhage. No evidence of acute infarct. No hydrocephalus. No extra-axial collection. No mass effect or midline shift. ORBITS: No acute abnormality. SINUSES: There is mucosal disease within the frontal and left ethmoid sinuses. There is also minimal mucosal disease within the left sphenoid sinus. SOFT TISSUES AND SKULL: No acute soft tissue abnormality. No skull fracture. [place] Stroke Program Early CT Score (ASPECTS): Ganglionic (caudate, IC, lentiform nucleus, insula, M1-M3): 7 Supraganglionic (M4-M6): 3 Total: 10 IMPRESSION: 1. No acute intracranial abnormality in the setting of suspected acute stroke. ASPECTS: 10. Findings communicated to Dr. Jerri at 12:15 PM 05/23/2024. 2. Mucosal disease within the frontal and left ethmoid sinuses, with minimal mucosal disease in the left sphenoid sinus. Electronically signed by: Evalene Coho MD 05/23/2024 12:17 PM EST RP Workstation: [id number]           LOS: 1 day   Time spent= 35 mins    Burgess JAYSON Dare, MD [facility]  If 7PM-7AM, please contact night-coverage  05/24/2024, 10:51 AM  "

## 2024-05-25 ENCOUNTER — Other Ambulatory Visit (HOSPITAL_COMMUNITY): Payer: Self-pay

## 2024-05-25 DIAGNOSIS — R29898 Other symptoms and signs involving the musculoskeletal system: Secondary | ICD-10-CM | POA: Diagnosis not present

## 2024-05-25 LAB — BASIC METABOLIC PANEL WITH GFR
Anion gap: 8 (ref 5–15)
BUN: 6 mg/dL (ref 6–20)
CO2: 23 mmol/L (ref 22–32)
Calcium: 8.4 mg/dL — ABNORMAL LOW (ref 8.9–10.3)
Chloride: 102 mmol/L (ref 98–111)
Creatinine, Ser: 0.58 mg/dL (ref 0.44–1.00)
GFR, Estimated: >60 mL/min
Glucose, Bld: 98 mg/dL (ref 70–99)
Potassium: 3.9 mmol/L (ref 3.5–5.1)
Sodium: 133 mmol/L — ABNORMAL LOW (ref 135–145)

## 2024-05-25 LAB — TYPE AND SCREEN
ABO/RH(D): B POS
Antibody Screen: NEGATIVE
Unit division: 0

## 2024-05-25 LAB — CBC
HCT: 28.3 % — ABNORMAL LOW (ref 36.0–46.0)
Hemoglobin: 8.2 g/dL — ABNORMAL LOW (ref 12.0–15.0)
MCH: 20.4 pg — ABNORMAL LOW (ref 26.0–34.0)
MCHC: 29 g/dL — ABNORMAL LOW (ref 30.0–36.0)
MCV: 70.6 fL — ABNORMAL LOW (ref 80.0–100.0)
Platelets: 157 K/uL (ref 150–400)
RBC: 4.01 MIL/uL (ref 3.87–5.11)
RDW: 27.9 % — ABNORMAL HIGH (ref 11.5–15.5)
WBC: 5.3 K/uL (ref 4.0–10.5)
nRBC: 0 % (ref 0.0–0.2)

## 2024-05-25 LAB — BPAM RBC
Blood Product Expiration Date: 202602082359
ISSUE DATE / TIME: 202601200429
Unit Type and Rh: 7300

## 2024-05-25 LAB — PHOSPHORUS: Phosphorus: 3.3 mg/dL (ref 2.5–4.6)

## 2024-05-25 LAB — CALCIUM, IONIZED: Calcium, Ionized, Serum: 4.4 mg/dL — ABNORMAL LOW (ref 4.5–5.6)

## 2024-05-25 MED ORDER — CALCIUM GLUCONATE-NACL 2-0.675 GM/100ML-% IV SOLN
2.0000 g | Freq: Once | INTRAVENOUS | Status: AC
  Administered 2024-05-25: 2000 mg via INTRAVENOUS
  Filled 2024-05-25: qty 100

## 2024-05-25 MED ORDER — PANTOPRAZOLE SODIUM 40 MG PO TBEC
40.0000 mg | DELAYED_RELEASE_TABLET | Freq: Every day | ORAL | 0 refills | Status: AC
  Filled 2024-05-25: qty 30, 30d supply, fill #0

## 2024-05-25 MED ORDER — VITAMIN D (ERGOCALCIFEROL) 1.25 MG (50000 UNIT) PO CAPS
50000.0000 [IU] | ORAL_CAPSULE | ORAL | 0 refills | Status: AC
  Filled 2024-05-25: qty 5, 35d supply, fill #0

## 2024-05-25 MED ORDER — MEGESTROL ACETATE 40 MG PO TABS
40.0000 mg | ORAL_TABLET | Freq: Two times a day (BID) | ORAL | 0 refills | Status: AC
  Filled 2024-05-25: qty 60, 30d supply, fill #0

## 2024-05-25 MED ORDER — MAGNESIUM OXIDE -MG SUPPLEMENT 400 (240 MG) MG PO TABS
800.0000 mg | ORAL_TABLET | Freq: Once | ORAL | Status: AC
  Administered 2024-05-25: 800 mg via ORAL
  Filled 2024-05-25: qty 2

## 2024-05-25 NOTE — Discharge Summary (Signed)
 Physician Discharge Summary  VALLEY KE FMW:[id number] DOB: 1985-10-05 DOA: 05/23/2024  PCP: Gari Lauraine BRAVO, FNP  Admit date: 05/23/2024 Discharge date: 05/25/2024  Admitted From: Home Disposition: Home Home  Recommendations for Outpatient Follow-up:  Follow up with PCP in 1-2 weeks Please obtain BMP/CBC in one week your next doctors visit.  Will need outpatient transvaginal ultrasound and follow-up with GYN Megace  40 mg twice daily prescribed Vitamin D  supplements Protonix  daily prior history of gastritis Advised to continue taking outpatient iron  supplements and follow-up with her hematology   Discharge Condition: Stable CODE STATUS: Full code Diet recommendation: Low-salt  Brief/Interim Summary: Brief Narrative:  39 year old female with history of anemia secondary to endometriosis/menorrhagia admitted for left lower extremity weakness.  Upon admission CTA head and neck did not show any LVO occlusion or acute abnormality.  Neurology was consulted,  CT head, CTA head and neck are unremarkable.  MRI brain is negative.  MRI cervical spine showing some edema around C6-C7 which is likely chronic changes.  LDL 39, A1c 4.9. Baseline hemoglobin around 7.5, hb 6.6 therefore 1 unit PRBC ordered.  Repeat hemoglobin stable.  Getting outpatient IV iron .  Also follows with GI and hematology.  Discussed with GYN, Dr. Zina.  Started on Megace  twice daily.  Transabdominal ultrasound overall unremarkable but patient declined transvaginal ultrasound.  She states she will follow-up outpatient. During hospitalization also required electrolyte repletion. Medically stable for discharge   Assessment & Plan:  Left lower extremity weakness - CT head, CTA head and neck are unremarkable.  MRI brain is negative.  MRI cervical spine showing some edema around C6-C7 which is likely chronic changes.  LDL 39, A1c 4.9.  Will await neurology recommendations  Hypokalemia/hypocalcemia Secondary to generalized  weakness - Repletion.  PT/OT recommended outpatient follow-up.  Had a low TSH but normal free T4  Severe vitamin D  deficiency - Supplements  Endometriosis/menorrhagia Acute blood loss anemia Severe iron  deficiency - Baseline hemoglobin around 7.5, hb 6.6 therefore 1 unit PRBC ordered.  Repeat hemoglobin stable.  Getting outpatient IV iron .  Also follows with GI and hematology.  Discussed with GYN, Dr. Zina.  Started on Megace  twice daily.  Transabdominal ultrasound overall unremarkable but patient declined transvaginal ultrasound.  She states she will follow-up outpatient.  December 2025 Colonoscopy-internal hemorrhoids Endoscopy-gastritis   DVT prophylaxis: enoxaparin  (LOVENOX ) injection 40 mg Start: 05/25/24 2200      Code Status: Full Code Family Communication:   Status is: Inpatient Remains inpatient appropriate because: disCharge today  PT Follow up Recs: Outpatient Pt1/20/2026 1316  Subjective: Feeling well no complaints Declines inpatient transvaginal ultrasound.  States that she will follow-up outpatient   Examination:  General exam: Appears calm and comfortable  Respiratory system: Clear to auscultation. Respiratory effort normal. Cardiovascular system: S1 & S2 heard, RRR. No JVD, murmurs, rubs, gallops or clicks. No pedal edema. Gastrointestinal system: Abdomen is nondistended, soft and nontender. No organomegaly or masses felt. Normal bowel sounds heard. Central nervous system: Alert and oriented. No focal neurological deficits. Extremities: Symmetric 5 x 5 power. Skin: No rashes, lesions or ulcers Psychiatry: Judgement and insight appear normal. Mood & affect appropriate.    Discharge Diagnoses:  Principal Problem:   Transient left leg weakness      Discharge Exam: Vitals:   05/25/24 0642 05/25/24 0749  BP: 107/81 110/72  Pulse: 75 75  Resp: 18 15  Temp: 98 F (36.7 C) 98.7 F (37.1 C)  SpO2: 100% 100%   Vitals:   05/24/24 2132 05/25/24  0021  05/25/24 0642 05/25/24 0749  BP: (!) 89/55 105/64 107/81 110/72  Pulse: 74 79 75 75  Resp: 16 18 18 15   Temp: 98.3 F (36.8 C) (!) 97.4 F (36.3 C) 98 F (36.7 C) 98.7 F (37.1 C)  TempSrc: Oral Oral Oral Oral  SpO2: 100% 100% 100% 100%  Weight:      Height:          Discharge Instructions  Discharge Instructions     Ambulatory referral to Physical Therapy   Complete by: As directed       Allergies as of 05/25/2024       Reactions   Monoferric  [ferric Derisomaltose ] Hives, Itching        Medication List     TAKE these medications    cyanocobalamin  1000 MCG/ML injection Commonly known as: VITAMIN B12 Inject 1,000 mcg into the muscle every Monday.   cyanocobalamin  1000 MCG tablet Commonly known as: VITAMIN B12 Take 1 tablet (1,000 mcg total) by mouth daily.   FeroSul 325 (65 Fe) MG tablet Generic drug: ferrous sulfate  Take 1 tablet (325 mg total) by mouth daily with breakfast. What changed: when to take this   hydrocortisone  25 MG suppository Commonly known as: ANUSOL -HC Place 1 suppository (25 mg total) rectally at bedtime.   megestrol  40 MG tablet Commonly known as: MEGACE  Take 1 tablet (40 mg total) by mouth 2 (two) times daily.   MIDOL  COMPLETE PO Take 2 tablets by mouth 2 (two) times daily as needed (abdomnial cramping, pain, back pain).   pantoprazole  40 MG tablet Commonly known as: PROTONIX  Take 1 tablet (40 mg total) by mouth daily before breakfast. Start taking on: May 26, 2024   polyethylene glycol powder 17 GM/SCOOP powder Commonly known as: GLYCOLAX /MIRALAX  Take 17 g by mouth daily as needed for moderate constipation. Dissolve 1 capful (17g) in 4-8 ounces of liquid and take by mouth daily.   Vitamin D  (Ergocalciferol ) 1.25 MG (50000 UNIT) Caps capsule Commonly known as: DRISDOL  Take 1 capsule (50,000 Units total) by mouth every 7 (seven) days. Start taking on: May 31, 2024        Follow-up Information     [facility]. Schedule an appointment as soon as possible for a visit.   Specialty: Rehabilitation Contact information: [address] [place]  [address] [phone]        [facility] Follow up in 1 week(s).   Contact information: Max         Gari Lauraine BRAVO, FNP Follow up in 1 week(s).   Specialty: Family Medicine Contact information: [address] [place] [address] [phone]                Allergies[1]  You were cared for by a hospitalist during your hospital stay. If you have any questions about your discharge medications or the care you received while you were in the hospital after you are discharged, you can call the unit and asked to speak with the hospitalist on call if the hospitalist that took care of you is not available. Once you are discharged, your primary care physician will handle any further medical issues. Please note that no refills for any discharge medications will be authorized once you are discharged, as it is imperative that you return to your primary care physician (or establish a relationship with a primary care physician if you do not have one) for your aftercare needs so that  they can reassess your need for medications and monitor your lab values.  You were cared for by a hospitalist during your hospital stay. If you have any questions about your discharge medications or the care you received while you were in the hospital after you are discharged, you can call the unit and asked to speak with the hospitalist on call if the hospitalist that took care of you is not available. Once you are discharged, your primary care physician will handle any further medical issues. Please note that NO REFILLS for any discharge medications will be authorized once you are discharged, as it is imperative that you return to your primary care physician (or establish a relationship with  a primary care physician if you do not have one) for your aftercare needs so that they can reassess your need for medications and monitor your lab values.  Please request your Prim.MD to go over all Hospital Tests and Procedure/Radiological results at the follow up, please get all Hospital records sent to your Prim MD by signing hospital release before you go home.  Get CBC, CMP, 2 view Chest X ray checked  by Primary MD during your next visit or SNF MD in 5-7 days ( we routinely change or add medications that can affect your baseline labs and fluid status, therefore we recommend that you get the mentioned basic workup next visit with your PCP, your PCP may decide not to get them or add new tests based on their clinical decision)  On your next visit with your primary care physician please Get Medicines reviewed and adjusted.  If you experience worsening of your admission symptoms, develop shortness of breath, life threatening emergency, suicidal or homicidal thoughts you must seek medical attention immediately by calling 911 or calling your MD immediately  if symptoms less severe.  You Must read complete instructions/literature along with all the possible adverse reactions/side effects for all the Medicines you take and that have been prescribed to you. Take any new Medicines after you have completely understood and accpet all the possible adverse reactions/side effects.   Do not drive, operate heavy machinery, perform activities at heights, swimming or participation in water activities or provide baby sitting services if your were admitted for syncope or siezures until you have seen by Primary MD or a Neurologist and advised to do so again.  Do not drive when taking Pain medications.   Procedures/Studies: US  PELVIS (TRANSABDOMINAL ONLY) Result Date: 05/24/2024 EXAM: PELVIC ULTRASOUND 05/24/2024 11:26:59 AM TECHNIQUE: Transabdominal pelvic duplex ultrasound using B-mode/gray scaled imaging with  color flow was obtained. Doppler waveform analysis was not requested or performed. The patient refused transvaginal imaging. COMPARISON: US  Pelvis 03/08/2019. CLINICAL HISTORY: Menorrhagia. FINDINGS: ULTRASOUND FINDINGS: UTERUS: Uterus measures 8.8 x 4.4 x 6.6 cm. Uterus demonstrates normal myometrial echotexture. ENDOMETRIAL STRIPE: Endometrium measures 2.9 mm. Endometrial stripe is within normal limits. RIGHT OVARY: Right ovary measures 2.7 x 1.6 x 1.7 cm. Right ovary is within normal limits. There is normal arterial and venous Doppler flow. Doppler waveform analysis was not requested or performed. LEFT OVARY: Left ovary measures 1.9 x 1.7 x 1.7 cm. Left ovary is within normal limits. There is normal arterial and venous Doppler flow. Doppler waveform analysis was not requested or performed. FREE FLUID: No free fluid. IMPRESSION: 1. No sonographic abnormality identified to explain menorrhagia on transabdominal pelvic ultrasound. Electronically signed by: Ryan Salvage MD 05/24/2024 12:09 PM EST RP Workstation: [id number]   MR BRAIN W WO CONTRAST Result Date: 05/23/2024 EXAM: MRI  BRAIN WITH AND WITHOUT CONTRAST 05/23/2024 03:27:17 PM TECHNIQUE: Multiplanar multisequence MRI of the head/brain was performed with and without the administration of intravenous contrast. COMPARISON: Head CT and CTA 05/23/2024. CLINICAL HISTORY: Neuro deficit, acute, stroke suspected. Left leg weakness. FINDINGS: BRAIN AND VENTRICLES: There is no evidence of an acute infarct, intracranial hemorrhage, mass, midline shift, hydrocephalus, or extra-axial fluid collection. Cerebral volume is borderline low for age. The brain is normal in signal, and no abnormal enhancement is identified. Major intracranial vascular flow voids are preserved. ORBITS: No significant abnormality. SINUSES: Left frontal and left ethmoid sinus secretions. Clear mastoid air cells. BONES AND SOFT TISSUES: Normal bone marrow signal. No soft tissue abnormality.  IMPRESSION: 1. No acute intracranial abnormality or focal finding to explain left leg weakness. Electronically signed by: Dasie Hamburg MD 05/23/2024 03:49 PM EST RP Workstation: [id number]   MR CERVICAL SPINE W WO CONTRAST Result Date: 05/23/2024 EXAM: MRI CERVICAL SPINE WITH AND WITHOUT CONTRAST 05/23/2024 03:10:25 PM TECHNIQUE: Multiplanar multisequence MRI of the cervical spine was performed without and with the administration of intravenous contrast. 6 mL (gadobutrol  (GADAVIST ) 1 MMOL/ML injection 6 mL GADOBUTROL  1 MMOL/ML IV SOLN). COMPARISON: Same day CTA head and neck. CLINICAL HISTORY: Neck pain, acute, no red flags. FINDINGS: BONES AND ALIGNMENT: Straightening of the normal cervical lordosis. No evidence of traumatic malalignment. Normal vertebral body heights. Bone marrow signal abnormality and bone marrow edema along the C6 inferior endplate with associated enhancement. Additional subtle endplate enhancement along the C7 superior endplate without associated edema. Findings are favored to be degenerative in etiology. SPINAL CORD: Normal spinal cord size. Normal spinal cord signal. SOFT TISSUES: No paraspinal mass. There is no paraspinal or epidural fluid collection. C2-C3: No significant disc herniation. No spinal canal stenosis or neural foraminal narrowing. C3-C4: No significant disc herniation. No spinal canal stenosis or neural foraminal narrowing. C4-C5: No significant disc herniation. No spinal canal stenosis or neural foraminal narrowing. C5-C6: There is a small disc bulge at C5-C6 which indents the ventral thecal sac without spinal canal stenosis. Subtle signal abnormality in the posterior aspect of the C5-C6 disc is likely degenerative etiology. C6-C7: Additional mild disc desiccation and small disc bulge at the C6-C7 level which indents the ventral thecal sac without contacting the spinal cord. There is no significant signal abnormality or fluid or enhancement within the C6-C7 disc space.  C7-T1: No significant disc herniation. No spinal canal stenosis or neural foraminal narrowing. There is no high grade spinal canal stenosis in the cervical spine. No high grade foraminal stenosis in the cervical spine. IMPRESSION: 1. Edema and enhancement at the C6 inferior endplate and subtle enhancement of the C7 superior endplate, favored degenerative in etiology. Infection is considered less likely. 2. There are mild degenerative changes of the C5-6 and C6-7 discs. 3. No fluid or signal abnormality in the C6-7 disc to suggest discitis. 4. No epidural or paraspinal fluid collection. 5. No high-grade spinal canal or foraminal stenosis. Electronically signed by: Donnice Mania MD 05/23/2024 03:47 PM EST RP Workstation: [id number]   CT ANGIO HEAD NECK W WO CM W PERF (CODE STROKE) Result Date: 05/23/2024 EXAM: CTA Head and Neck with Perfusion 05/23/2024 12:23:13 PM TECHNIQUE: CTA of the head and neck was performed without and with the administration of 100 mL of iohexol  (OMNIPAQUE ) 350 MG/ML injection. 3D postprocessing with multiplanar reconstructions and MIPs was performed to evaluate the vascular anatomy. Cerebral perfusion analysis using computed tomography with contrast administration, including post-processing of parametric maps with determination of cerebral  blood flow, cerebral blood volume, mean transit time and time-to-maximum. Automated exposure control, iterative reconstruction, and/or weight based adjustment of the mA/kV was utilized to reduce the radiation dose to as low as reasonably achievable. COMPARISON: CT of the head dated 05/23/2024 CLINICAL HISTORY: Neuro deficit, acute, stroke suspected. FINDINGS: CTA NECK: AORTIC ARCH AND ARCH VESSELS: No dissection or arterial injury. No significant stenosis of the brachiocephalic or subclavian arteries. CERVICAL CAROTID ARTERIES: No dissection, arterial injury, or hemodynamically significant stenosis by NASCET criteria. CERVICAL VERTEBRAL ARTERIES: No  dissection, arterial injury, or significant stenosis. LUNGS AND MEDIASTINUM: Unremarkable. SOFT TISSUES: No acute abnormality. BONES: No acute abnormality. CTA HEAD: ANTERIOR CIRCULATION: No significant stenosis of the internal carotid arteries. No significant stenosis of the anterior cerebral arteries. No significant stenosis of the middle cerebral arteries. No aneurysm. POSTERIOR CIRCULATION: No significant stenosis of the posterior cerebral arteries. No significant stenosis of the basilar artery. No significant stenosis of the vertebral arteries. No aneurysm. OTHER: No dural venous sinus thrombosis on this non-dedicated study. CT PERFUSION: EXAM QUALITY: Exam quality is adequate with diagnostic perfusion maps. No significant motion artifact. Appropriate arterial inflow and venous outflow curves. CORE INFARCT (CBF<30% volume): 0 mL TOTAL HYPOPERFUSION (Tmax>6s volume): 0 mL PENUMBRA: Mismatch volume: 0 mL Mismatch ratio: not applicable Location: not applicable The above findings were communicated to Dr. Jerri at 12:29 pm 05/23/2024. IMPRESSION: 1. No acute large vessel occlusion. 2. No hemodynamically significant stenosis or aneurysm in the head or neck vessels. 3. No evidence of ischemia by CT brain perfusion. 4. Above findings were communicated to Dr. Jerri at 12:29 PM on 05/23/2024. Electronically signed by: Evalene Coho MD 05/23/2024 12:30 PM EST RP Workstation: [id number]   CT HEAD CODE STROKE WO CONTRAST Result Date: 05/23/2024 EXAM: CT HEAD WITHOUT CONTRAST 05/23/2024 12:08:43 PM TECHNIQUE: CT of the head was performed without the administration of intravenous contrast. Automated exposure control, iterative reconstruction, and/or weight based adjustment of the mA/kV was utilized to reduce the radiation dose to as low as reasonably achievable. COMPARISON: None available. CLINICAL HISTORY: Neuro deficit, acute, stroke suspected Neuro deficit, acute, stroke suspected Neuro deficit, acute, stroke suspected  Neuro deficit, acute, stroke suspected Neuro deficit, acute, stroke suspected FINDINGS: BRAIN AND VENTRICLES: No acute hemorrhage. No evidence of acute infarct. No hydrocephalus. No extra-axial collection. No mass effect or midline shift. ORBITS: No acute abnormality. SINUSES: There is mucosal disease within the frontal and left ethmoid sinuses. There is also minimal mucosal disease within the left sphenoid sinus. SOFT TISSUES AND SKULL: No acute soft tissue abnormality. No skull fracture. [place] Stroke Program Early CT Score (ASPECTS): Ganglionic (caudate, IC, lentiform nucleus, insula, M1-M3): 7 Supraganglionic (M4-M6): 3 Total: 10 IMPRESSION: 1. No acute intracranial abnormality in the setting of suspected acute stroke. ASPECTS: 10. Findings communicated to Dr. Jerri at 12:15 PM 05/23/2024. 2. Mucosal disease within the frontal and left ethmoid sinuses, with minimal mucosal disease in the left sphenoid sinus. Electronically signed by: Evalene Coho MD 05/23/2024 12:17 PM EST RP Workstation: [id number]     The results of significant diagnostics from this hospitalization (including imaging, microbiology, ancillary and laboratory) are listed below for reference.     Microbiology: No results found for this or any previous visit (from the past 240 hours).   Labs: BNP (last 3 results) No results for input(s): BNP in the last 8760 hours. Basic Metabolic Panel: Recent Labs  Lab 05/23/24 1202 05/23/24 1204 05/24/24 0253 05/24/24 0852 05/25/24 0247  NA 137 140 135  --  133*  K 3.4* 3.3* 3.2*  --  3.9  CL 105 104 103  --  102  CO2 20*  --  24  --  23  GLUCOSE 97 96 101*  --  98  BUN 8 6 6   --  6  CREATININE 0.57 0.60 0.62  --  0.58  CALCIUM  8.7*  --  8.6*  --  8.4*  MG  --   --   --  1.6*  --   PHOS  --   --   --  4.4 3.3   Liver Function Tests: Recent Labs  Lab 05/23/24 1202  AST 18  ALT 12  ALKPHOS 48  BILITOT 0.5  PROT 6.5  ALBUMIN 4.1   No results for input(s): LIPASE,  AMYLASE in the last 168 hours. No results for input(s): AMMONIA in the last 168 hours. CBC: Recent Labs  Lab 05/23/24 1202 05/23/24 1204 05/24/24 0253 05/25/24 0247  WBC 7.5  --  4.6 5.3  NEUTROABS 5.9  --   --   --   HGB 7.0* 8.5* 6.6* 8.2*  HCT 24.5* 25.0* 22.9* 28.3*  MCV 69.0*  --  69.2* 70.6*  PLT 140*  --  137* 157   Cardiac Enzymes: No results for input(s): CKTOTAL, CKMB, CKMBINDEX, TROPONINI in the last 168 hours. BNP: Invalid input(s): POCBNP CBG: Recent Labs  Lab 05/23/24 1158  GLUCAP 96   D-Dimer No results for input(s): DDIMER in the last 72 hours. Hgb A1c Recent Labs    05/24/24 0253  HGBA1C 4.9   Lipid Profile Recent Labs    05/24/24 0253  CHOL 93  HDL 41  LDLCALC 39  TRIG 66  CHOLHDL 2.3   Thyroid  function studies Recent Labs    05/24/24 0852  TSH 0.278*   Anemia work up No results for input(s): VITAMINB12, FOLATE, FERRITIN, TIBC, IRON , RETICCTPCT in the last 72 hours. Urinalysis    Component Value Date/Time   COLORURINE YELLOW 07/11/2023 1730   APPEARANCEUR HAZY (A) 07/11/2023 1730   LABSPEC >1.046 (H) 07/11/2023 1730   PHURINE 5.0 07/11/2023 1730   GLUCOSEU NEGATIVE 07/11/2023 1730   HGBUR NEGATIVE 07/11/2023 1730   BILIRUBINUR NEGATIVE 07/11/2023 1730   KETONESUR 80 (A) 07/11/2023 1730   PROTEINUR 100 (A) 07/11/2023 1730   NITRITE NEGATIVE 07/11/2023 1730   LEUKOCYTESUR NEGATIVE 07/11/2023 1730   Sepsis Labs Recent Labs  Lab 05/23/24 1202 05/24/24 0253 05/25/24 0247  WBC 7.5 4.6 5.3   Microbiology No results found for this or any previous visit (from the past 240 hours).   Time coordinating discharge:  I have spent 35 minutes face to face with the patient and on the ward discussing the patients care, assessment, plan and disposition with other care givers. >50% of the time was devoted counseling the patient about the risks and benefits of treatment/Discharge disposition and coordinating care.    SIGNED:   Burgess JAYSON Dare, MD  [facility] 05/25/2024, 10:14 AM   If 7PM-7AM, please contact night-coverage      [1]  Allergies Allergen Reactions   Monoferric  [Ferric Derisomaltose ] Hives and Itching

## 2024-05-25 NOTE — Evaluation (Signed)
 Speech Language Pathology Evaluation Patient Details Name: Maureen Hensley MRN: [id number] DOB: 03/05/1986 Today's Date: 05/25/2024 Time: 9169-9154 SLP Time Calculation (min) (ACUTE ONLY): 15 min  Problem List:  Patient Active Problem List   Diagnosis Date Noted   Transient left leg weakness 05/23/2024   Vitamin B12 deficiency 04/25/2024   Iron  deficiency anemia, unspecified 04/11/2024   Rectal bleeding 04/07/2024   Gastritis and gastroduodenitis 04/07/2024   Lower GI bleeding 04/06/2024   Symptomatic anemia 04/06/2024   Pain of upper abdomen 04/06/2024   Chest pain 07/04/2023   Thrombocytosis 07/04/2023   Acute on chronic blood loss anemia 06/30/2018   Iron  deficiency anemia 06/30/2018   Menorrhagia 06/30/2018   Viral gastroenteritis 06/30/2018   HA (headache) 06/30/2018   Past Medical History:  Past Medical History:  Diagnosis Date   Anemia    Past Surgical History:  Past Surgical History:  Procedure Laterality Date   CESAREAN SECTION     3   COLONOSCOPY N/A 04/07/2024   Procedure: COLONOSCOPY;  Surgeon: Shila Gustav GAILS, MD;  Location: [facility] ENDOSCOPY;  Service: Gastroenterology;  Laterality: N/A;   ECTOPIC PREGNANCY SURGERY     ESOPHAGOGASTRODUODENOSCOPY N/A 04/07/2024   Procedure: EGD (ESOPHAGOGASTRODUODENOSCOPY);  Surgeon: Nandigam, Kavitha V, MD;  Location: [facility] ENDOSCOPY;  Service: Gastroenterology;  Laterality: N/A;   HPI:  39 y.o. female presents to [facility] hospital on 05/23/2024 with LLE weakness along with sharp back and neck pain. MRI negative for acute findings. PMH includes anemia.   Assessment / Plan / Recommendation Clinical Impression  Patient was evaluated via portions of the Cognistat standardized assessment to evaluate cognitive linguistic functioning. Patient independently recalled current medical situation and biographical information re: self, family and job(s). Patient independently oriented to self, situation, location and time. Patient recalled 4/4 words  after a delay and demonstrated adequate awareness, attention and problem solving. No further ST needs required. SLP to s/o.    SLP Assessment  SLP Recommendation/Assessment: Patient does not need any further Speech Language Pathology Services     Assistance Recommended at Discharge  None  Functional Status Assessment Patient has not had a recent decline in their functional status     SLP Evaluation Cognition  Overall Cognitive Status: Within Functional Limits for tasks assessed Orientation Level: Oriented X4 Year: 2026 Month: January Day of Week: Correct Attention: Focused;Sustained Focused Attention: Appears intact Sustained Attention: Appears intact Memory: Appears intact Awareness: Appears intact Problem Solving: Appears intact Safety/Judgment: Appears intact       Comprehension  Auditory Comprehension Yes/No Questions: Within Functional Limits Commands: Within Functional Limits    Expression Expression Primary Mode of Expression: Verbal Verbal Expression Overall Verbal Expression: Appears within functional limits for tasks assessed   Oral / Motor  Oral Motor/Sensory Function Overall Oral Motor/Sensory Function: Within functional limits Motor Speech Overall Motor Speech: Appears within functional limits for tasks assessed           Nikko Goldwire M.A., CCC-SLP 05/25/2024, 8:51 AM

## 2024-05-25 NOTE — Evaluation (Signed)
 Occupational Therapy Evaluation Patient Details Name: Maureen Hensley MRN: [id number] DOB: [birth year] Today's Date: 05/25/2024   History of Present Illness   39 y.o. female presents to [facility] hospital on 05/23/2024 with LLE weakness along with sharp back and neck pain. MRI negative for acute findings. PMH includes anemia.     Clinical Impressions Prior to this admission, patient working at [employer], did not drive, and lives with family. Currently, patient reporting no deficits and no further blurred vision. OT did note difficulty with basic sequencing, however this appears to be baseline. Patient otherwise at her baseline from an ADL and transfer perspective. OT signing off at this time, with no further acute needs needed.      If plan is discharge home, recommend the following:   Assist for transportation     Functional Status Assessment   Patient has not had a recent decline in their functional status     Equipment Recommendations   None recommended by OT     Recommendations for Other Services         Precautions/Restrictions   Precautions Precautions: Fall Recall of Precautions/Restrictions: Intact Restrictions Weight Bearing Restrictions Per Provider Order: No     Mobility Bed Mobility Overal bed mobility: Modified Independent                  Transfers Overall transfer level: Modified independent Equipment used: None                      Balance Overall balance assessment: Needs assistance Sitting-balance support: No upper extremity supported, Feet supported Sitting balance-Leahy Scale: Good     Standing balance support: No upper extremity supported, During functional activity Standing balance-Leahy Scale: Good                             ADL either performed or assessed with clinical judgement   ADL Overall ADL's : At baseline                                       General ADL Comments: Prior  to this admission, patient working at [employer], did not drive, and lives with family. Currently, patient reporting no deficits and no further blurred vision. OT did note difficulty with basic sequencing, however this appears to be baseline. Patient otherwise at her baseline from an ADL and transfer perspective. OT signing off at this time, with no further acute needs needed.     Vision Baseline Vision/History: 1 Wears glasses Ability to See in Adequate Light: 0 Adequate Patient Visual Report: No change from baseline Vision Assessment?: Yes Eye Alignment: Within Functional Limits Ocular Range of Motion: Within Functional Limits Alignment/Gaze Preference: Within Defined Limits Tracking/Visual Pursuits: Able to track stimulus in all quads without difficulty Saccades: Within functional limits Convergence: Within functional limits Visual Fields: No apparent deficits Additional Comments: did not report any visual deficits, wears glasses but not present     Perception         Praxis         Pertinent Vitals/Pain Pain Assessment Pain Assessment: No/denies pain     Extremity/Trunk Assessment Upper Extremity Assessment Upper Extremity Assessment: Overall WFL for tasks assessed   Lower Extremity Assessment Lower Extremity Assessment: Overall WFL for tasks assessed       Communication Communication Communication: No apparent difficulties  Cognition Arousal: Alert Behavior During Therapy: WFL for tasks assessed/performed Cognition: Difficult to assess             OT - Cognition Comments: appears to be at baseline, difficulty with sequencing, when asked to count backwards stating, I dont mess with things like that                 Following commands: Intact       Cueing  General Comments   Cueing Techniques: Verbal cues      Exercises     Shoulder Instructions      Home Living Family/patient expects to be discharged to:: Private residence Living  Arrangements: Spouse/significant other;Other relatives (significant other and many children) Available Help at Discharge: Family;Available PRN/intermittently Type of Home: House Home Access: Stairs to enter [employer] of Steps: 5 Entrance Stairs-Rails: Can reach both Home Layout: Two level Alternate Level Stairs-Number of Steps: 17 Alternate Level Stairs-Rails: Left Bathroom Shower/Tub: Chief Strategy Officer: Standard     Home Equipment: None          Prior Functioning/Environment Prior Level of Function : Independent/Modified Independent;Working/employed             Mobility Comments: working 2 jobs involving standing for many consecutive hours ADLs Comments: independent    OT Problem List: Decreased cognition   OT Treatment/Interventions:        OT Goals(Current goals can be found in the care plan section)   Acute Rehab OT Goals Patient Stated Goal: to go home OT Goal Formulation: With patient Time For Goal Achievement: 06/08/24 Potential to Achieve Goals: Good   OT Frequency:       Co-evaluation              AM-PAC OT 6 Clicks Daily Activity     Outcome Measure Help from another person eating meals?: None Help from another person taking care of personal grooming?: None Help from another person toileting, which includes using toliet, bedpan, or urinal?: None Help from another person bathing (including washing, rinsing, drying)?: None Help from another person to put on and taking off regular upper body clothing?: None Help from another person to put on and taking off regular lower body clothing?: None 6 Click Score: 24   End of Session Nurse Communication: Mobility status;Other (comment) (IV leaking)  Activity Tolerance: Patient tolerated treatment well Patient left: in bed;with call bell/phone within reach;with nursing/sitter in room  OT Visit Diagnosis: Other abnormalities of gait and mobility (R26.89)                 Time: 1052-1100 OT Time Calculation (min): 8 min Charges:  OT General Charges $OT Visit: 1 Visit OT Evaluation $OT Eval Low Complexity: 1 Low  Ronal Gift E. Lashaunta Sicard, OTR/L Acute Rehabilitation Services [phone]   Ronal Gift Salt 05/25/2024, 1:27 PM

## 2024-05-25 NOTE — TOC Transition Note (Signed)
 Transition of Care [facility]) - Discharge Note   Patient Details  Name: Maureen Hensley MRN: [id number] Date of Birth: 05/10/1985  Transition of Care [facility]) CM/SW Contact:  Andrez JULIANNA George, RN Phone Number: 05/25/2024, 10:04 AM   Clinical Narrative:     Pt is discharging home with self care. No further needs per ICM.    Barriers to Discharge: No Barriers Identified   Patient Goals and CMS Choice     Choice offered to / list presented to : Patient      Discharge Placement                       Discharge Plan and Services Additional resources added to the After Visit Summary for     Discharge Planning Services: CM Consult                                 Social Drivers of Health (SDOH) Interventions SDOH Screenings   Food Insecurity: No Food Insecurity (05/24/2024)  Recent Concern: Food Insecurity - Food Insecurity Present (04/06/2024)  Housing: Low Risk (05/24/2024)  Transportation Needs: No Transportation Needs (05/24/2024)  Utilities: Not At Risk (05/24/2024)  Alcohol Screen: Low Risk (04/19/2024)  Depression (PHQ2-9): Low Risk (04/25/2024)  Financial Resource Strain: Low Risk (04/19/2024)  Physical Activity: Inactive (04/19/2024)  Social Connections: Moderately Isolated (07/05/2023)  Stress: Stress Concern Present (04/19/2024)  Tobacco Use: Medium Risk (05/23/2024)     Readmission Risk Interventions    04/07/2024    2:15 PM 04/07/2024    2:08 PM  Readmission Risk Prevention Plan  Post Dischage Appt Complete   Medication Screening Complete Complete  Transportation Screening Complete Complete

## 2024-05-25 NOTE — Progress Notes (Signed)
 Patient for discharge home today. IV calcium  gluconate completed.  IV and telemetry discontinued.  AVS discussed.  Patient is alert and oriented,  denies any pain or discomfort.  Patient is waiting for ride to transport home.

## 2024-05-25 NOTE — Plan of Care (Signed)
 " Problem: Education: Goal: Knowledge of disease or condition will improve Outcome: Completed/Met Goal: Knowledge of secondary prevention will improve (MUST DOCUMENT ALL) Outcome: Completed/Met Goal: Knowledge of patient specific risk factors will improve (DELETE if not current risk factor) Outcome: Completed/Met   Problem: Ischemic Stroke/TIA Tissue Perfusion: Goal: Complications of ischemic stroke/TIA will be minimized Outcome: Completed/Met   Problem: Coping: Goal: Will verbalize positive feelings about self Outcome: Completed/Met Goal: Will identify appropriate support needs Outcome: Completed/Met   Problem: Health Behavior/Discharge Planning: Goal: Ability to manage health-related needs will improve Outcome: Completed/Met Goal: Goals will be collaboratively established with patient/family Outcome: Completed/Met   Problem: Self-Care: Goal: Ability to participate in self-care as condition permits will improve Outcome: Completed/Met Goal: Verbalization of feelings and concerns over difficulty with self-care will improve Outcome: Completed/Met Goal: Ability to communicate needs accurately will improve Outcome: Completed/Met   Problem: Nutrition: Goal: Risk of aspiration will decrease Outcome: Completed/Met Goal: Dietary intake will improve Outcome: Completed/Met   Problem: Education: Goal: Knowledge of General Education information will improve Description: Including pain rating scale, medication(s)/side effects and non-pharmacologic comfort measures Outcome: Completed/Met   Problem: Health Behavior/Discharge Planning: Goal: Ability to manage health-related needs will improve Outcome: Completed/Met   Problem: Clinical Measurements: Goal: Ability to maintain clinical measurements within normal limits will improve Outcome: Completed/Met Goal: Will remain free from infection Outcome: Completed/Met Goal: Diagnostic test results will improve Outcome:  Completed/Met Goal: Respiratory complications will improve Outcome: Completed/Met Goal: Cardiovascular complication will be avoided Outcome: Completed/Met   Problem: Activity: Goal: Risk for activity intolerance will decrease Outcome: Completed/Met   Problem: Nutrition: Goal: Adequate nutrition will be maintained Outcome: Completed/Met   Problem: Coping: Goal: Level of anxiety will decrease Outcome: Completed/Met   Problem: Elimination: Goal: Will not experience complications related to bowel motility Outcome: Completed/Met Goal: Will not experience complications related to urinary retention Outcome: Completed/Met   Problem: Pain Managment: Goal: General experience of comfort will improve and/or be controlled Outcome: Completed/Met   Problem: Safety: Goal: Ability to remain free from injury will improve Outcome: Completed/Met   Problem: Skin Integrity: Goal: Risk for impaired skin integrity will decrease Outcome: Completed/Met   Problem: Education: Goal: Knowledge of disease or condition will improve Outcome: Completed/Met Goal: Knowledge of secondary prevention will improve (MUST DOCUMENT ALL) Outcome: Completed/Met Goal: Knowledge of patient specific risk factors will improve (DELETE if not current risk factor) Outcome: Completed/Met   Problem: Ischemic Stroke/TIA Tissue Perfusion: Goal: Complications of ischemic stroke/TIA will be minimized Outcome: Completed/Met   Problem: Coping: Goal: Will verbalize positive feelings about self Outcome: Completed/Met Goal: Will identify appropriate support needs Outcome: Completed/Met   Problem: Health Behavior/Discharge Planning: Goal: Ability to manage health-related needs will improve Outcome: Completed/Met Goal: Goals will be collaboratively established with patient/family Outcome: Completed/Met   Problem: Self-Care: Goal: Ability to participate in self-care as condition permits will improve Outcome:  Completed/Met Goal: Verbalization of feelings and concerns over difficulty with self-care will improve Outcome: Completed/Met Goal: Ability to communicate needs accurately will improve Outcome: Completed/Met   Problem: Nutrition: Goal: Risk of aspiration will decrease Outcome: Completed/Met Goal: Dietary intake will improve Outcome: Completed/Met   Problem: Education: Goal: Knowledge of General Education information will improve Description: Including pain rating scale, medication(s)/side effects and non-pharmacologic comfort measures Outcome: Completed/Met   Problem: Health Behavior/Discharge Planning: Goal: Ability to manage health-related needs will improve Outcome: Completed/Met   Problem: Clinical Measurements: Goal: Ability to maintain clinical measurements within normal limits will improve Outcome: Completed/Met Goal: Will remain free from infection  Outcome: Completed/Met Goal: Diagnostic test results will improve Outcome: Completed/Met Goal: Respiratory complications will improve Outcome: Completed/Met Goal: Cardiovascular complication will be avoided Outcome: Completed/Met   Problem: Activity: Goal: Risk for activity intolerance will decrease Outcome: Completed/Met   Problem: Nutrition: Goal: Adequate nutrition will be maintained Outcome: Completed/Met   Problem: Coping: Goal: Level of anxiety will decrease Outcome: Completed/Met   Problem: Elimination: Goal: Will not experience complications related to bowel motility Outcome: Completed/Met Goal: Will not experience complications related to urinary retention Outcome: Completed/Met   Problem: Pain Managment: Goal: General experience of comfort will improve and/or be controlled Outcome: Completed/Met   Problem: Safety: Goal: Ability to remain free from injury will improve Outcome: Completed/Met   Problem: Skin Integrity: Goal: Risk for impaired skin integrity will decrease Outcome: Completed/Met    Problem: Education: Goal: Knowledge of disease or condition will improve Outcome: Completed/Met Goal: Knowledge of secondary prevention will improve (MUST DOCUMENT ALL) Outcome: Completed/Met Goal: Knowledge of patient specific risk factors will improve (DELETE if not current risk factor) Outcome: Completed/Met   Problem: Ischemic Stroke/TIA Tissue Perfusion: Goal: Complications of ischemic stroke/TIA will be minimized Outcome: Completed/Met   Problem: Coping: Goal: Will verbalize positive feelings about self Outcome: Completed/Met Goal: Will identify appropriate support needs Outcome: Completed/Met   Problem: Health Behavior/Discharge Planning: Goal: Ability to manage health-related needs will improve Outcome: Completed/Met Goal: Goals will be collaboratively established with patient/family Outcome: Completed/Met   Problem: Self-Care: Goal: Ability to participate in self-care as condition permits will improve Outcome: Completed/Met Goal: Verbalization of feelings and concerns over difficulty with self-care will improve Outcome: Completed/Met Goal: Ability to communicate needs accurately will improve Outcome: Completed/Met   Problem: Nutrition: Goal: Risk of aspiration will decrease Outcome: Completed/Met Goal: Dietary intake will improve Outcome: Completed/Met   Problem: Education: Goal: Knowledge of General Education information will improve Description: Including pain rating scale, medication(s)/side effects and non-pharmacologic comfort measures Outcome: Completed/Met   Problem: Health Behavior/Discharge Planning: Goal: Ability to manage health-related needs will improve Outcome: Completed/Met   Problem: Clinical Measurements: Goal: Ability to maintain clinical measurements within normal limits will improve Outcome: Completed/Met Goal: Will remain free from infection Outcome: Completed/Met Goal: Diagnostic test results will improve Outcome:  Completed/Met Goal: Respiratory complications will improve Outcome: Completed/Met Goal: Cardiovascular complication will be avoided Outcome: Completed/Met   Problem: Activity: Goal: Risk for activity intolerance will decrease Outcome: Completed/Met   Problem: Nutrition: Goal: Adequate nutrition will be maintained Outcome: Completed/Met   Problem: Coping: Goal: Level of anxiety will decrease Outcome: Completed/Met   Problem: Elimination: Goal: Will not experience complications related to bowel motility Outcome: Completed/Met Goal: Will not experience complications related to urinary retention Outcome: Completed/Met   Problem: Pain Managment: Goal: General experience of comfort will improve and/or be controlled Outcome: Completed/Met   Problem: Safety: Goal: Ability to remain free from injury will improve Outcome: Completed/Met   Problem: Skin Integrity: Goal: Risk for impaired skin integrity will decrease Outcome: Completed/Met   "

## 2024-05-30 ENCOUNTER — Inpatient Hospital Stay

## 2024-06-01 ENCOUNTER — Telehealth: Payer: Self-pay | Admitting: Hematology and Oncology

## 2024-06-01 ENCOUNTER — Inpatient Hospital Stay

## 2024-06-01 DIAGNOSIS — E538 Deficiency of other specified B group vitamins: Secondary | ICD-10-CM | POA: Diagnosis not present

## 2024-06-01 MED ORDER — CYANOCOBALAMIN 1000 MCG/ML IJ SOLN
1000.0000 ug | Freq: Once | INTRAMUSCULAR | Status: AC
  Administered 2024-06-01: 1000 ug via INTRAMUSCULAR
  Filled 2024-06-01: qty 1

## 2024-06-01 NOTE — Telephone Encounter (Signed)
 Pt called to move time of appt back

## 2024-06-03 ENCOUNTER — Ambulatory Visit: Attending: Internal Medicine | Admitting: Physical Therapy

## 2024-06-03 NOTE — Therapy (Incomplete)
 " OUTPATIENT PHYSICAL THERAPY NEURO EVALUATION   Patient Name: Maureen Hensley MRN: [id number] DOB:01-Jun-1985, 39 y.o., female Today's Date: 06/03/2024   PCP: *** REFERRING PROVIDER: Caleen Burgess BROCKS, MD  END OF SESSION:   Past Medical History:  Diagnosis Date   Anemia    Past Surgical History:  Procedure Laterality Date   CESAREAN SECTION     3   COLONOSCOPY N/A 04/07/2024   Procedure: COLONOSCOPY;  Surgeon: Shila Gustav GAILS, MD;  Location: [facility] ENDOSCOPY;  Service: Gastroenterology;  Laterality: N/A;   ECTOPIC PREGNANCY SURGERY     ESOPHAGOGASTRODUODENOSCOPY N/A 04/07/2024   Procedure: EGD (ESOPHAGOGASTRODUODENOSCOPY);  Surgeon: Nandigam, Kavitha V, MD;  Location: [facility] ENDOSCOPY;  Service: Gastroenterology;  Laterality: N/A;   Patient Active Problem List   Diagnosis Date Noted   Transient left leg weakness 05/23/2024   Vitamin B12 deficiency 04/25/2024   Iron  deficiency anemia, unspecified 04/11/2024   Rectal bleeding 04/07/2024   Gastritis and gastroduodenitis 04/07/2024   Lower GI bleeding 04/06/2024   Symptomatic anemia 04/06/2024   Pain of upper abdomen 04/06/2024   Chest pain 07/04/2023   Thrombocytosis 07/04/2023   Acute on chronic blood loss anemia 06/30/2018   Iron  deficiency anemia 06/30/2018   Menorrhagia 06/30/2018   Viral gastroenteritis 06/30/2018   HA (headache) 06/30/2018    ONSET DATE: 05/24/2024  REFERRING DIAG: R53.1 (ICD-10-CM) - Left-sided weakness  THERAPY DIAG:  No diagnosis found.  Rationale for Evaluation and Treatment: Rehabilitation  SUBJECTIVE:                                                                                                                                                                                             SUBJECTIVE STATEMENT: ***  Pt accompanied by: {accompnied:27141}  PERTINENT HISTORY: ***PMH: anemia  Per acute care PT note 05/25/2024: 39 y.o. female presents to [facility] hospital on 05/23/2024 with LLE weakness  along with sharp back and neck pain. MRI negative for acute findings. PMH includes anemia.  PAIN:  Are you having pain? {OPRCPAIN:27236}  PRECAUTIONS: {Therapy precautions:24002}  RED FLAGS: {PT Red Flags:29287}   WEIGHT BEARING RESTRICTIONS: {Yes ***/No:24003}  FALLS: Has patient fallen in last 6 months? {fallsyesno:27318}  LIVING ENVIRONMENT: Lives with: {OPRC lives with:25569::lives with their family} Lives in: {Lives in:25570} Stairs: {opstairs:27293} Has following equipment at home: {Assistive devices:23999}  PLOF: {PLOF:24004}  PATIENT GOALS: ***  OBJECTIVE:  Note: Objective measures were completed at Evaluation unless otherwise noted.  DIAGNOSTIC FINDINGS: ***  Brain MRI 05/23/2024 IMPRESSION: 1. No acute intracranial abnormality or focal finding to explain left leg weakness.  Cervical Spine MRI 05/23/2024 IMPRESSION: 1. Edema and enhancement at  the C6 inferior endplate and subtle enhancement of the C7 superior endplate, favored degenerative in etiology. Infection is considered less likely. 2. There are mild degenerative changes of the C5-6 and C6-7 discs. 3. No fluid or signal abnormality in the C6-7 disc to suggest discitis. 4. No epidural or paraspinal fluid collection. 5. No high-grade spinal canal or foraminal stenosis.  COGNITION: Overall cognitive status: {cognition:24006}   SENSATION: {sensation:27233}  COORDINATION: ***  EDEMA:  {edema:24020}  MUSCLE TONE: {LE tone:25568}  MUSCLE LENGTH: Hamstrings: Right *** deg; Left *** deg Debby test: Right *** deg; Left *** deg  DTRs:  {DTR SITE:24025}  POSTURE: {posture:25561}  LOWER EXTREMITY ROM:     {AROM/PROM:27142}  Right Eval Left Eval  Hip flexion    Hip extension    Hip abduction    Hip adduction    Hip internal rotation    Hip external rotation    Knee flexion    Knee extension    Ankle dorsiflexion    Ankle plantarflexion    Ankle inversion    Ankle eversion      (Blank rows = not tested)  LOWER EXTREMITY MMT:    MMT Right Eval Left Eval  Hip flexion    Hip extension    Hip abduction    Hip adduction    Hip internal rotation    Hip external rotation    Knee flexion    Knee extension    Ankle dorsiflexion    Ankle plantarflexion    Ankle inversion    Ankle eversion    (Blank rows = not tested)  BED MOBILITY:  {bed mobility:32615:p}  TRANSFERS: {transfers eval:32620}  RAMP:  {ramp eval:32616}  CURB:  {curb eval:32617}  STAIRS: {stairs eval:32618} GAIT: Findings: {GaitneuroPT:32644::Distance walked: ***,Comments: ***}  FUNCTIONAL TESTS:  {Functional tests:24029}  PATIENT SURVEYS:  {rehab surveys:24030}                                                                                                                              TREATMENT DATE: *** PT Evaluation   PATIENT EDUCATION: Education details: *** Person educated: {Person educated:25204} Education method: {Education Method:25205} Education comprehension: {Education Comprehension:25206}  HOME EXERCISE PROGRAM: ***  GOALS: Goals reviewed with patient? {yes/no:20286}  SHORT TERM GOALS: Target date: ***  *** Baseline: Goal status: INITIAL  2.  *** Baseline:  Goal status: INITIAL  3.  *** Baseline:  Goal status: INITIAL  4.  *** Baseline:  Goal status: INITIAL  5.  *** Baseline:  Goal status: INITIAL  6.  *** Baseline:  Goal status: INITIAL  LONG TERM GOALS: Target date: ***  *** Baseline:  Goal status: INITIAL  2.  *** Baseline:  Goal status: INITIAL  3.  *** Baseline:  Goal status: INITIAL  4.  *** Baseline:  Goal status: INITIAL  5.  *** Baseline:  Goal status: INITIAL  6.  *** Baseline:  Goal status: INITIAL  ASSESSMENT:  CLINICAL IMPRESSION: Patient is  a *** year old *** referred to Neuro OPP[facility] for***.   Pt's PMH is significant for: *** The following deficits were present during the exam: ***. Based on ***,  pt is an incr risk for falls. Pt would benefit from skilled PT to address these impairments and functional limitations to maximize functional mobility independence.   OBJECTIVE IMPAIRMENTS: {opptimpairments:25111}.   ACTIVITY LIMITATIONS: {activitylimitations:27494}  PARTICIPATION LIMITATIONS: {participationrestrictions:25113}  PERSONAL FACTORS: {Personal factors:25162} are also affecting patient's functional outcome.   REHAB POTENTIAL: {rehabpotential:25112}  CLINICAL DECISION MAKING: {clinical decision making:25114}  EVALUATION COMPLEXITY: {Evaluation complexity:25115}  PLAN:  PT FREQUENCY: {rehab frequency:25116}  PT DURATION: {rehab duration:25117}  PLANNED INTERVENTIONS: {rehab planned interventions:25118::97110-Therapeutic exercises,97530- Therapeutic [phone]- Neuromuscular re-education,97535- Self Rjmz,02859- Manual therapy,Patient/Family education}  PLAN FOR NEXT SESSION: ***   Waddell Southgate, PT Waddell Southgate, PT, DPT, CSRS  06/03/2024, 10:03 AM  For all possible CPT codes, reference the Planned Interventions line above.     Check all conditions that are expected to impact treatment: {Conditions expected to impact treatment:{Conditions expected to impact treatment:28273}   If treatment provided at initial evaluation, no treatment charged due to lack of authorization.             "

## 2024-06-13 ENCOUNTER — Encounter: Payer: Self-pay | Admitting: Family Medicine

## 2024-06-27 ENCOUNTER — Inpatient Hospital Stay: Attending: Hematology and Oncology

## 2024-06-27 ENCOUNTER — Inpatient Hospital Stay

## 2024-06-27 ENCOUNTER — Inpatient Hospital Stay: Admitting: Hematology and Oncology
